# Patient Record
Sex: Female | Born: 1960 | Race: White | Hispanic: No | Marital: Married | Smoking: Never smoker
Health system: Southern US, Community
[De-identification: ages and names within clinical notes are randomized; demographics above are authoritative.]

## PROBLEM LIST (undated history)

## (undated) DIAGNOSIS — Z5189 Encounter for other specified aftercare: Secondary | ICD-10-CM

## (undated) DIAGNOSIS — J302 Other seasonal allergic rhinitis: Secondary | ICD-10-CM

## (undated) DIAGNOSIS — C439 Malignant melanoma of skin, unspecified: Secondary | ICD-10-CM

## (undated) HISTORY — DX: Other seasonal allergic rhinitis: J30.2

## (undated) HISTORY — DX: Encounter for other specified aftercare: Z51.89

## (undated) HISTORY — DX: Malignant melanoma of skin, unspecified: C43.9

## (undated) HISTORY — PX: LAPAROSCOPY: SHX197

---

## 1987-04-28 DIAGNOSIS — Z5189 Encounter for other specified aftercare: Secondary | ICD-10-CM

## 1987-04-28 HISTORY — DX: Encounter for other specified aftercare: Z51.89

## 1988-04-27 HISTORY — PX: LAPAROSCOPY: SHX197

## 1995-04-28 HISTORY — PX: ABDOMINAL HYSTERECTOMY: SHX81

## 1998-05-07 ENCOUNTER — Other Ambulatory Visit: Admission: RE | Admit: 1998-05-07 | Discharge: 1998-05-07 | Payer: Self-pay | Admitting: Gynecology

## 1999-10-17 ENCOUNTER — Other Ambulatory Visit: Admission: RE | Admit: 1999-10-17 | Discharge: 1999-10-17 | Payer: Self-pay | Admitting: Gynecology

## 2001-04-05 ENCOUNTER — Ambulatory Visit (HOSPITAL_COMMUNITY): Admission: RE | Admit: 2001-04-05 | Discharge: 2001-04-05 | Payer: Self-pay | Admitting: Gynecology

## 2001-04-05 ENCOUNTER — Encounter: Payer: Self-pay | Admitting: Gynecology

## 2001-04-25 ENCOUNTER — Other Ambulatory Visit: Admission: RE | Admit: 2001-04-25 | Discharge: 2001-04-25 | Payer: Self-pay | Admitting: Gynecology

## 2001-07-26 ENCOUNTER — Ambulatory Visit (HOSPITAL_COMMUNITY): Admission: RE | Admit: 2001-07-26 | Discharge: 2001-07-26 | Payer: Self-pay | Admitting: *Deleted

## 2001-08-05 ENCOUNTER — Ambulatory Visit (HOSPITAL_BASED_OUTPATIENT_CLINIC_OR_DEPARTMENT_OTHER): Admission: RE | Admit: 2001-08-05 | Discharge: 2001-08-05 | Payer: Self-pay | Admitting: *Deleted

## 2001-08-05 ENCOUNTER — Encounter (INDEPENDENT_AMBULATORY_CARE_PROVIDER_SITE_OTHER): Payer: Self-pay | Admitting: *Deleted

## 2002-04-27 DIAGNOSIS — C439 Malignant melanoma of skin, unspecified: Secondary | ICD-10-CM

## 2002-04-27 HISTORY — PX: OTHER SURGICAL HISTORY: SHX169

## 2002-04-27 HISTORY — DX: Malignant melanoma of skin, unspecified: C43.9

## 2002-06-26 ENCOUNTER — Encounter: Payer: Self-pay | Admitting: Orthopedic Surgery

## 2002-06-26 ENCOUNTER — Ambulatory Visit (HOSPITAL_COMMUNITY): Admission: RE | Admit: 2002-06-26 | Discharge: 2002-06-26 | Payer: Self-pay | Admitting: Orthopedic Surgery

## 2002-07-26 ENCOUNTER — Other Ambulatory Visit: Admission: RE | Admit: 2002-07-26 | Discharge: 2002-07-26 | Payer: Self-pay | Admitting: Gynecology

## 2003-10-15 ENCOUNTER — Other Ambulatory Visit: Admission: RE | Admit: 2003-10-15 | Discharge: 2003-10-15 | Payer: Self-pay | Admitting: Gynecology

## 2005-01-05 ENCOUNTER — Other Ambulatory Visit: Admission: RE | Admit: 2005-01-05 | Discharge: 2005-01-05 | Payer: Self-pay | Admitting: Gynecology

## 2005-01-09 ENCOUNTER — Ambulatory Visit (HOSPITAL_COMMUNITY): Admission: RE | Admit: 2005-01-09 | Discharge: 2005-01-09 | Payer: Self-pay | Admitting: Gynecology

## 2006-01-29 ENCOUNTER — Ambulatory Visit (HOSPITAL_COMMUNITY): Admission: RE | Admit: 2006-01-29 | Discharge: 2006-01-29 | Payer: Self-pay | Admitting: Gynecology

## 2006-02-10 ENCOUNTER — Encounter: Admission: RE | Admit: 2006-02-10 | Discharge: 2006-02-10 | Payer: Self-pay | Admitting: Gynecology

## 2006-02-15 ENCOUNTER — Other Ambulatory Visit: Admission: RE | Admit: 2006-02-15 | Discharge: 2006-02-15 | Payer: Self-pay | Admitting: Gynecology

## 2007-03-14 ENCOUNTER — Encounter: Admission: RE | Admit: 2007-03-14 | Discharge: 2007-03-14 | Payer: Self-pay | Admitting: Gynecology

## 2007-03-14 ENCOUNTER — Other Ambulatory Visit: Admission: RE | Admit: 2007-03-14 | Discharge: 2007-03-14 | Payer: Self-pay | Admitting: Gynecology

## 2008-07-19 ENCOUNTER — Encounter: Admission: RE | Admit: 2008-07-19 | Discharge: 2008-07-19 | Payer: Self-pay | Admitting: Gynecology

## 2010-05-17 ENCOUNTER — Encounter: Payer: Self-pay | Admitting: Gynecology

## 2010-09-12 NOTE — Op Note (Signed)
. [facility]  Patient:    Natasha Foster, Natasha Foster Visit Number: [id number] MRN: [id number]          Service Type: DSU Location: [facility] Attending Physician:  Vikki Ports. Dictated by:   Catalina Lunger, M.D. Proc. Date: 08/05/01 Admit Date:  08/05/2001   CC:         Ria Bush. Jorja Loa, M.D.   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  PROCEDURES: 1. Blue dye injection. 2. Lymphatic mapping. 3. Wide excision of malignant melanoma, measuring 4 cm. 4. Left inguinal sentinel lymph node biopsy.  ANESTHESIA:  General.  DESCRIPTION OF PROCEDURE: After having radiology inject the isotope into the left buttocks, the patient was taken to preop holding. She was then taken to the operating room and placed in a supine position.  She underwent general anesthesia and was placed in the right decubitus position.  The area of the previous biopsy was identified, prepped, and draped, and about 1 cm of ______ blue dye was injected intradermally.  The area was prepped and draped in the normal sterile fashion.  An elliptical incision measuring 1 cm out from the periphery of the previously biopsied area was obtained.  This was extended in both superior and inferior directions to a smoother ellipse.  The tissue was excised down to the subcutaneous fat and sent for pathologic evaluation. Adequate hemostasis was ensured.  Flaps were created in all directions, and the skin was closed with interrupted 3-0 nylon sutures.  Using the Neoprobe after the patient had been placed in the supine position, the left groin was prepped and draped in the normal sterile fashion.  An area of high activity in the groin crease was identified.  A small incision was made over it and dissected down through subcutaneous fat.  Following blue lymphatics in the lymph node, we  isolated a high activity blue lymph node, which was excised.  Small venous tributaries were ligated and clipped, and the ex vivo counts were in the 3,000-5,000 range. In vivo residual counts were between 0 and 5.  The defect was closed with a running 3-0 subcutaneous suture and a subcuticular 4-0 Monocryl suture.  Steri-Strips and sterile dressings were applied.  The patient tolerated the procedure well and went to PACU in good condition. Dictated by:   Catalina Lunger, M.D. Attending Physician:  Danna Hefty R. DD:  08/05/01 TD:  08/07/01 Job: [id number]

## 2010-09-12 NOTE — Op Note (Signed)
[place]  Patient:    VENTURA, LEGGITT Visit Number: [id number] MRN: [id number]          Service Type: DSU Location: [facility] Attending Physician:  Vikki Ports. Dictated by:   Vikki Ports, M.D. Proc. Date: 08/05/01 Admit Date:  08/05/2001   CC:         Ria Bush. Jorja Loa, M.D.   Operative Report  PREOPERATIVE DIAGNOSIS:   Malignant 4 melanoma of the left buttock.  POSTOPERATIVE DIAGNOSIS:  Malignant 4 melanoma of the left buttock.  OPERATION: 1. Blue dye injection. 2. Lymphatic mapping. 3. Wide excision of malignant melanoma measuring 4 cm. 4. Left inguinal sentinel lymph node biopsy.  SURGEON:  Vikki Ports, M.D.  ANESTHESIA:  General.  DESCRIPTION OF PROCEDURE:  After having radiology inject the isotope into the left buttock, the patient was taken to preoperative holding.  She was then taken to the operating room, placed in the supine position and underwent general anesthesia and was placed in the right decubitus position.  The area of the previous biopsy was identified, prepped and draped and about 1 cc of Lymphazurin blue dye was injected intradermally.  The area was prepped and draped in the normal sterile fashion.  An elliptical incision measuring 1 cm out from the periphery from the previously biopsied area was obtained. This was extended in both superior and inferior directions to make a smoother ellipse.  The tissue was excised down to the subcutaneous fat and sent for pathologic evaluation.  Adequate hemostasis was ensured.  Flaps were created in all directions, and the skin was closed with interrupted 3-0 nylon sutures.  Using the Neoprobe after the patient had been placed in the supine position, the left groin was prepped and draped in the normal sterile fashion.  An area of high activity in the groin crease was identified.  A small incision was made over it.  I dissected down through subcutaneous  fat.  Following blue lymphatics and the lymph node I isolated a high activity blue lymph node which was excised.  Small venous tributaries were ligated and clipped, and the ex vivo counts were in the 3000-5000 range.  In vivo residual counts were between 0-5.  The defect was closed with a running 3-0 subcutaneous suture and a 4-0 subcuticular 4-0 Monocryl suture.  Steri-Strips and sterile dressing were applied.  The patient tolerated the procedure well and went to PACU in good condition. Dictated by:   Vikki Ports, M.D. Attending Physician:  Danna Hefty R. DD:  08/05/01 TD:  08/07/01 Job: [id number]

## 2010-10-21 ENCOUNTER — Other Ambulatory Visit: Payer: Self-pay | Admitting: Gynecology

## 2010-10-21 DIAGNOSIS — Z1231 Encounter for screening mammogram for malignant neoplasm of breast: Secondary | ICD-10-CM

## 2010-11-04 ENCOUNTER — Ambulatory Visit
Admission: RE | Admit: 2010-11-04 | Discharge: 2010-11-04 | Disposition: A | Discharge status: Home / Self Care | Payer: BC Managed Care – PPO | Source: Ambulatory Visit | Attending: Gynecology | Admitting: Gynecology

## 2010-11-04 DIAGNOSIS — Z1231 Encounter for screening mammogram for malignant neoplasm of breast: Secondary | ICD-10-CM

## 2011-11-02 ENCOUNTER — Other Ambulatory Visit: Payer: Self-pay | Admitting: Gynecology

## 2011-11-02 DIAGNOSIS — Z1231 Encounter for screening mammogram for malignant neoplasm of breast: Secondary | ICD-10-CM

## 2011-11-06 ENCOUNTER — Ambulatory Visit: Payer: BC Managed Care – PPO

## 2011-11-12 ENCOUNTER — Ambulatory Visit
Admission: RE | Admit: 2011-11-12 | Discharge: 2011-11-12 | Disposition: A | Discharge status: Home / Self Care | Payer: BC Managed Care – PPO | Source: Ambulatory Visit | Attending: Gynecology | Admitting: Gynecology

## 2011-11-12 DIAGNOSIS — Z1231 Encounter for screening mammogram for malignant neoplasm of breast: Secondary | ICD-10-CM

## 2012-08-16 ENCOUNTER — Other Ambulatory Visit: Payer: Self-pay | Admitting: Orthopedic Surgery

## 2012-08-16 DIAGNOSIS — M25562 Pain in left knee: Secondary | ICD-10-CM

## 2012-08-19 ENCOUNTER — Ambulatory Visit
Admission: RE | Admit: 2012-08-19 | Discharge: 2012-08-19 | Disposition: A | Discharge status: Home / Self Care | Payer: BC Managed Care – PPO | Source: Ambulatory Visit | Attending: Orthopedic Surgery | Admitting: Orthopedic Surgery

## 2012-08-19 DIAGNOSIS — M25562 Pain in left knee: Secondary | ICD-10-CM

## 2013-09-26 ENCOUNTER — Encounter: Payer: Self-pay | Admitting: Internal Medicine

## 2013-09-28 ENCOUNTER — Ambulatory Visit (AMBULATORY_SURGERY_CENTER): Payer: Self-pay | Admitting: *Deleted

## 2013-09-28 VITALS — Ht 66.0 in | Wt 187.6 lb

## 2013-09-28 DIAGNOSIS — Z1211 Encounter for screening for malignant neoplasm of colon: Secondary | ICD-10-CM

## 2013-09-28 MED ORDER — MOVIPREP 100 G PO SOLR: ORAL | Status: DC

## 2013-09-28 NOTE — Progress Notes (Signed)
No allergies to eggs or soy. No problems with anesthesia.  Pt given Emmi instructions for colonoscopy  No oxygen use  No diet drug use  

## 2013-10-03 ENCOUNTER — Encounter: Payer: Self-pay | Admitting: Internal Medicine

## 2013-10-09 ENCOUNTER — Ambulatory Visit (AMBULATORY_SURGERY_CENTER): Payer: BC Managed Care – PPO | Admitting: Internal Medicine

## 2013-10-09 ENCOUNTER — Encounter: Payer: Self-pay | Admitting: Internal Medicine

## 2013-10-09 VITALS — BP 132/71 | HR 63 | Temp 98.1°F | Resp 18 | Ht 66.0 in | Wt 187.0 lb

## 2013-10-09 DIAGNOSIS — Z1211 Encounter for screening for malignant neoplasm of colon: Secondary | ICD-10-CM

## 2013-10-09 MED ORDER — SODIUM CHLORIDE 0.9 % IV SOLN: 500.0000 mL | INTRAVENOUS | Status: DC

## 2013-10-09 NOTE — Patient Instructions (Signed)
YOU HAD AN ENDOSCOPIC PROCEDURE TODAY AT THE [facility]: Refer to the procedure report that was given to you for any specific questions about what was found during the examination.  If the procedure report does not answer your questions, please call your gastroenterologist to clarify.  If you requested that your care partner not be given the details of your procedure findings, then the procedure report has been included in a sealed envelope for you to review at your convenience later.  YOU SHOULD EXPECT: Some feelings of bloating in the abdomen. Passage of more gas than usual.  Walking can help get rid of the air that was put into your GI tract during the procedure and reduce the bloating. If you had a lower endoscopy (such as a colonoscopy or flexible sigmoidoscopy) you may notice spotting of blood in your stool or on the toilet paper. If you underwent a bowel prep for your procedure, then you may not have a normal bowel movement for a few days.  DIET: Your first meal following the procedure should be a light meal and then it is ok to progress to your normal diet.  A half-sandwich or bowl of soup is an example of a good first meal.  Heavy or fried foods are harder to digest and may make you feel nauseous or bloated.  Likewise meals heavy in dairy and vegetables can cause extra gas to form and this can also increase the bloating.  Drink plenty of fluids but you should avoid alcoholic beverages for 24 hours.  ACTIVITY: Your care partner should take you home directly after the procedure.  You should plan to take it easy, moving slowly for the rest of the day.  You can resume normal activity the day after the procedure however you should NOT DRIVE or use heavy machinery for 24 hours (because of the sedation medicines used during the test).    SYMPTOMS TO REPORT IMMEDIATELY: A gastroenterologist can be reached at any hour.  During normal business hours, 8:30 AM to 5:00 PM Monday through Friday,  call [phone].  After hours and on weekends, please call the GI answering service at [phone] who will take a message and have the physician on call contact you.   Following lower endoscopy (colonoscopy or flexible sigmoidoscopy):  Excessive amounts of blood in the stool  Significant tenderness or worsening of abdominal pains  Swelling of the abdomen that is new, acute  Fever of 100F or higher  FOLLOW UP: If any biopsies were taken you will be contacted by phone or by letter within the next 1-3 weeks.  Call your gastroenterologist if you have not heard about the biopsies in 3 weeks.  Our staff will call the home number listed on your records the next business day following your procedure to check on you and address any questions or concerns that you may have at that time regarding the information given to you following your procedure. This is a courtesy call and so if there is no answer at the home number and we have not heard from you through the emergency physician on call, we will assume that you have returned to your regular daily activities without incident.  SIGNATURES/CONFIDENTIALITY: You and/or your care partner have signed paperwork which will be entered into your electronic medical record.  These signatures attest to the fact that that the information above on your After Visit Summary has been reviewed and is understood.  Full responsibility of the confidentiality of this   discharge information lies with you and/or your care-partner.  Normal exam  Repeat colonoscopy in 10 years-2025.

## 2013-10-09 NOTE — Progress Notes (Signed)
Procedure ends, to recovery, report given and VSS. 

## 2013-10-09 NOTE — Op Note (Signed)
[place]  [address]. [place], [address]   COLONOSCOPY PROCEDURE REPORT  PATIENT: Natasha Foster, Natasha Foster  MR#: [id number] BIRTHDATE: 1960/11/09 , 43  yrs. old GENDER: Female ENDOSCOPIST: Eustace Quail, MD REFERRED BY:.  [employer] PROCEDURE DATE:  10/09/2013 PROCEDURE:   Colonoscopy, screening First Screening Colonoscopy - Avg.  risk and is 50 yrs.  old or older Yes.  Prior Negative Screening - Now for repeat screening. N/A  History of Adenoma - Now for follow-up colonoscopy & has been > or = to 3 yrs.  N/A  Polyps Removed Today? No.  Recommend repeat exam, <10 yrs? No. ASA CLASS:   Class II INDICATIONS:average risk screening. MEDICATIONS: MAC sedation, administered by CRNA and propofol (Diprivan) 270mg  IV  DESCRIPTION OF PROCEDURE:   After the risks benefits and alternatives of the procedure were thoroughly explained, informed consent was obtained.  A digital rectal exam revealed hemorrhoids. The LB [id number]  endoscope was introduced through the anus and advanced to the cecum, which was identified by both the appendix and ileocecal valve. No adverse events experienced.   The quality of the prep was excellent, using MoviPrep  The instrument was then slowly withdrawn as the colon was fully examined.      COLON FINDINGS: A normal appearing cecum, ileocecal valve, and appendiceal orifice were identified.  The ascending, hepatic flexure, transverse, splenic flexure, descending, sigmoid colon and rectum appeared unremarkable.  No polyps or cancers were seen. Retroflexed views revealed internal hemorrhoids. The time to cecum=1 minutes 58 seconds.  Withdrawal time=9 minutes 16 seconds. The scope was withdrawn and the procedure completed.  COMPLICATIONS: There were no complications.  ENDOSCOPIC IMPRESSION: Normal colon  RECOMMENDATIONS: Continue current colorectal screening recommendations for "routine risk" patients with a repeat colonoscopy in 10  years.   eSigned:  Eustace Quail, MD 10/09/2013 9:36 AM   cc: The Patient and Darcus Austin, MD

## 2013-10-10 ENCOUNTER — Telehealth: Payer: Self-pay | Admitting: *Deleted

## 2013-10-10 NOTE — Telephone Encounter (Signed)
  Follow up Call-  Call back number 10/09/2013  Post procedure Call Back phone  # [phone]  Permission to leave phone message No  comments does not have voicemail     Patient questions:  Do you have a fever, pain , or abdominal swelling? no Pain Score  0 *  Have you tolerated food without any problems? yes  Have you been able to return to your normal activities? yes  Do you have any questions about your discharge instructions: Diet   no Medications  no Follow up visit  no  Do you have questions or concerns about your Care? no  Actions: * If pain score is 4 or above: No action needed, pain <4.

## 2015-01-21 ENCOUNTER — Other Ambulatory Visit: Payer: Self-pay

## 2015-01-21 DIAGNOSIS — Z1231 Encounter for screening mammogram for malignant neoplasm of breast: Secondary | ICD-10-CM

## 2015-01-23 ENCOUNTER — Ambulatory Visit
Admission: RE | Admit: 2015-01-23 | Discharge: 2015-01-23 | Disposition: A | Discharge status: Home / Self Care | Payer: BLUE CROSS/BLUE SHIELD | Source: Ambulatory Visit

## 2015-01-23 DIAGNOSIS — Z1231 Encounter for screening mammogram for malignant neoplasm of breast: Secondary | ICD-10-CM

## 2015-01-25 ENCOUNTER — Ambulatory Visit: Payer: Self-pay

## 2016-01-07 DIAGNOSIS — Z85820 Personal history of malignant melanoma of skin: Secondary | ICD-10-CM | POA: Diagnosis not present

## 2016-01-07 DIAGNOSIS — L821 Other seborrheic keratosis: Secondary | ICD-10-CM | POA: Diagnosis not present

## 2016-01-07 DIAGNOSIS — L820 Inflamed seborrheic keratosis: Secondary | ICD-10-CM | POA: Diagnosis not present

## 2016-01-07 DIAGNOSIS — D2361 Other benign neoplasm of skin of right upper limb, including shoulder: Secondary | ICD-10-CM | POA: Diagnosis not present

## 2016-01-07 DIAGNOSIS — D485 Neoplasm of uncertain behavior of skin: Secondary | ICD-10-CM | POA: Diagnosis not present

## 2016-01-07 DIAGNOSIS — D2261 Melanocytic nevi of right upper limb, including shoulder: Secondary | ICD-10-CM | POA: Diagnosis not present

## 2016-01-07 DIAGNOSIS — D225 Melanocytic nevi of trunk: Secondary | ICD-10-CM | POA: Diagnosis not present

## 2016-01-14 DIAGNOSIS — L080 Pyoderma: Secondary | ICD-10-CM | POA: Diagnosis not present

## 2016-01-14 DIAGNOSIS — L259 Unspecified contact dermatitis, unspecified cause: Secondary | ICD-10-CM | POA: Diagnosis not present

## 2016-02-03 DIAGNOSIS — L905 Scar conditions and fibrosis of skin: Secondary | ICD-10-CM | POA: Diagnosis not present

## 2016-02-21 DIAGNOSIS — B349 Viral infection, unspecified: Secondary | ICD-10-CM | POA: Diagnosis not present

## 2016-04-01 DIAGNOSIS — Z0000 Encounter for general adult medical examination without abnormal findings: Secondary | ICD-10-CM | POA: Diagnosis not present

## 2016-04-01 DIAGNOSIS — E559 Vitamin D deficiency, unspecified: Secondary | ICD-10-CM | POA: Diagnosis not present

## 2016-04-01 DIAGNOSIS — E538 Deficiency of other specified B group vitamins: Secondary | ICD-10-CM | POA: Diagnosis not present

## 2016-04-01 DIAGNOSIS — Z23 Encounter for immunization: Secondary | ICD-10-CM | POA: Diagnosis not present

## 2016-09-14 DIAGNOSIS — R3915 Urgency of urination: Secondary | ICD-10-CM | POA: Diagnosis not present

## 2017-04-15 ENCOUNTER — Other Ambulatory Visit: Payer: Self-pay | Admitting: Family Medicine

## 2017-04-15 DIAGNOSIS — E538 Deficiency of other specified B group vitamins: Secondary | ICD-10-CM | POA: Diagnosis not present

## 2017-04-15 DIAGNOSIS — Z0000 Encounter for general adult medical examination without abnormal findings: Secondary | ICD-10-CM | POA: Diagnosis not present

## 2017-04-15 DIAGNOSIS — Z1231 Encounter for screening mammogram for malignant neoplasm of breast: Secondary | ICD-10-CM

## 2017-04-15 DIAGNOSIS — E559 Vitamin D deficiency, unspecified: Secondary | ICD-10-CM | POA: Diagnosis not present

## 2017-04-15 DIAGNOSIS — Z136 Encounter for screening for cardiovascular disorders: Secondary | ICD-10-CM | POA: Diagnosis not present

## 2017-05-14 ENCOUNTER — Ambulatory Visit
Admission: RE | Admit: 2017-05-14 | Discharge: 2017-05-14 | Disposition: A | Discharge status: Home / Self Care | Payer: BLUE CROSS/BLUE SHIELD | Source: Ambulatory Visit | Attending: Family Medicine | Admitting: Family Medicine

## 2017-05-14 DIAGNOSIS — Z1231 Encounter for screening mammogram for malignant neoplasm of breast: Secondary | ICD-10-CM

## 2018-04-21 DIAGNOSIS — M2550 Pain in unspecified joint: Secondary | ICD-10-CM | POA: Diagnosis not present

## 2018-04-21 DIAGNOSIS — E669 Obesity, unspecified: Secondary | ICD-10-CM | POA: Diagnosis not present

## 2018-04-21 DIAGNOSIS — Z13220 Encounter for screening for lipoid disorders: Secondary | ICD-10-CM | POA: Diagnosis not present

## 2018-04-21 DIAGNOSIS — E538 Deficiency of other specified B group vitamins: Secondary | ICD-10-CM | POA: Diagnosis not present

## 2018-04-21 DIAGNOSIS — Z23 Encounter for immunization: Secondary | ICD-10-CM | POA: Diagnosis not present

## 2018-04-21 DIAGNOSIS — Z0000 Encounter for general adult medical examination without abnormal findings: Secondary | ICD-10-CM | POA: Diagnosis not present

## 2018-04-21 DIAGNOSIS — R7989 Other specified abnormal findings of blood chemistry: Secondary | ICD-10-CM | POA: Diagnosis not present

## 2018-04-21 DIAGNOSIS — E559 Vitamin D deficiency, unspecified: Secondary | ICD-10-CM | POA: Diagnosis not present

## 2018-04-25 ENCOUNTER — Ambulatory Visit (INDEPENDENT_AMBULATORY_CARE_PROVIDER_SITE_OTHER): Payer: Self-pay

## 2018-04-25 ENCOUNTER — Encounter (INDEPENDENT_AMBULATORY_CARE_PROVIDER_SITE_OTHER): Payer: Self-pay | Admitting: Orthopaedic Surgery

## 2018-04-25 ENCOUNTER — Ambulatory Visit (INDEPENDENT_AMBULATORY_CARE_PROVIDER_SITE_OTHER): Payer: BLUE CROSS/BLUE SHIELD | Admitting: Orthopaedic Surgery

## 2018-04-25 VITALS — BP 132/86 | HR 82 | Ht 67.0 in | Wt 192.0 lb

## 2018-04-25 DIAGNOSIS — M79641 Pain in right hand: Secondary | ICD-10-CM

## 2018-04-25 DIAGNOSIS — M79601 Pain in right arm: Secondary | ICD-10-CM | POA: Diagnosis not present

## 2018-04-25 DIAGNOSIS — M79642 Pain in left hand: Secondary | ICD-10-CM

## 2018-04-25 DIAGNOSIS — M180 Bilateral primary osteoarthritis of first carpometacarpal joints: Secondary | ICD-10-CM | POA: Insufficient documentation

## 2018-04-25 NOTE — Progress Notes (Signed)
Office Visit Note   Patient: Natasha Foster           Date of Birth: 1960-12-06           MRN: [id number] Visit Date: 04/25/2018              Requested by: Darcus Austin, MD [address] [place], [place] [address] PCP: Darcus Austin, MD   Assessment & Plan: Visit Diagnoses:  1. Bilateral hand pain   2. Right arm pain   3. Primary osteoarthritis of both first carpometacarpal joints     Plan: We discussed using intermittent Tylenol.  Splint applied she can use on her right thumb when she is active doing pulling pushing housecleaning type activities.  Continued walking program with weight loss and health maintenance discussed.  We plan to recheck her in 2 months.  X-ray results with for [facility] arthritis reviewed.  Follow-Up Instructions: Return in about 2 months (around 06/25/2018).   Orders:  Orders Placed This Encounter  Procedures  . XR Humerus Right  . XR Hand Complete Right  . XR Hand Complete Left   No orders of the defined types were placed in this encounter.     Procedures: No procedures performed   Clinical Data: No additional findings.   Subjective: Chief Complaint  Patient presents with  . Right Hand - Pain  . Left Hand - Pain  . Right Arm - Pain    HPI 57 year old female right-hand-dominant seen with bilateral hand pain at the base of the thumb.  Previous injection left hand by Dr. due to first [facility] joint gave her relief for many months.  She been pulling on the string of relief but over had increased pain in her right shoulder region.  This is improved slightly.  She has pain and tenderness in the upper arm pain with gripping and squeezing of the first [facility] joint on the right hand worse than left hand.  Her mother passed away after several years of illness this is something she has had to deal with getting over.  She gained some weight is lost 8 pounds now and is working on walking and weight loss due to increased BMI.  She denies fever or  chills.  No associated neck pain.  No bowel or bladder symptoms.  No problems in her shoulder with bathing or washing her dressing.  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  History of malignant stage IV melanoma left buttocks.  BMI 30, positive for bilateral [facility] arthritis, recent lab work showed some elevation of liver enzymes after she had been taking likely supratherapeutic dosages of Aleve now stopped on Aleve otherwise -14 point systems as a pertains HPI.   Objective: Vital Signs: BP 132/86   Pulse 82   Ht 5\' 7"  (1.702 m)   Wt 192 lb (87.1 kg)   BMI 30.07 kg/m   Physical Exam Constitutional:      Appearance: She is well-developed.  HENT:     Head: Normocephalic.     Right Ear: External ear normal.     Left Ear: External ear normal.  Eyes:     Pupils: Pupils are equal, round, and reactive to light.  Neck:     Thyroid: No thyromegaly.     Trachea: No tracheal deviation.  Cardiovascular:     Rate and Rhythm: Normal rate.  Pulmonary:     Effort: Pulmonary effort is normal.  Abdominal:     Palpations: Abdomen is soft.  Skin:    General: Skin is warm and dry.  Neurological:     Mental Status: She is alert and oriented to person, place, and time.  Psychiatric:        Behavior: Behavior normal.     Ortho Exam negative brachial plexus compression test.  Negative Spurling upper extremity reflexes are 2+ and symmetrical.  Tenderness positive grind test for [facility] joint right greater than left.  EPL FPL active.  No thenar atrophy.  Negative carpal compression test.  Interossei are strong.  Minimal discomfort with impingement right shoulder.  Long of the biceps mild to moderately tender.  No distal biceps tendon tenderness at the antecubital fossa.  Normal pronation supination.  Positive bilateral grind test negative Finkelstein test.  Dorsal compartments over the wrist are normal.  No snuffbox tenderness.  Specialty Comments:  No specialty comments  available.  Imaging: No results found.   PMFS History: There are no active problems to display for this patient.  Past Medical History:  Diagnosis Date  . Blood transfusion without reported diagnosis 1989   during laparoscopy  . Melanoma ([place]) 2004   left buttocks  . Seasonal allergies     Family History  Problem Relation Age of Onset  . Colon cancer Neg Hx   . Esophageal cancer Neg Hx   . Rectal cancer Neg Hx   . Stomach cancer Neg Hx     Past Surgical History:  Procedure Laterality Date  . ABDOMINAL HYSTERECTOMY  1997  . LAPAROSCOPY  1990   for endometriosis; punctured aorta during surgery, per pt" didn't put intestines back in correctly- had to go back in and repostiton correctly  . LAPAROSCOPY     x2- prior to puncturing of aorta  . melanoma surgery Left 2004   buttocks   Social History   Occupational History  . Not on file  Tobacco Use  . Smoking status: Never Smoker  . Smokeless tobacco: Never Used  Substance and Sexual Activity  . Alcohol use: Yes    Comment: rare  . Drug use: No  . Sexual activity: Not on file

## 2018-06-22 ENCOUNTER — Other Ambulatory Visit: Payer: Self-pay | Admitting: Dermatology

## 2018-06-22 DIAGNOSIS — Z85820 Personal history of malignant melanoma of skin: Secondary | ICD-10-CM | POA: Diagnosis not present

## 2018-06-22 DIAGNOSIS — D485 Neoplasm of uncertain behavior of skin: Secondary | ICD-10-CM | POA: Diagnosis not present

## 2018-06-22 DIAGNOSIS — L720 Epidermal cyst: Secondary | ICD-10-CM | POA: Diagnosis not present

## 2018-06-22 DIAGNOSIS — D229 Melanocytic nevi, unspecified: Secondary | ICD-10-CM | POA: Diagnosis not present

## 2018-06-23 DIAGNOSIS — E559 Vitamin D deficiency, unspecified: Secondary | ICD-10-CM | POA: Diagnosis not present

## 2018-06-23 DIAGNOSIS — R740 Nonspecific elevation of levels of transaminase and lactic acid dehydrogenase [LDH]: Secondary | ICD-10-CM | POA: Diagnosis not present

## 2018-06-28 ENCOUNTER — Ambulatory Visit (INDEPENDENT_AMBULATORY_CARE_PROVIDER_SITE_OTHER): Payer: BLUE CROSS/BLUE SHIELD | Admitting: Orthopaedic Surgery

## 2018-10-03 ENCOUNTER — Telehealth: Payer: Self-pay | Admitting: Internal Medicine

## 2018-10-03 ENCOUNTER — Telehealth: Payer: Self-pay

## 2018-10-03 NOTE — Telephone Encounter (Signed)
Patient called would like to know what she should do she has been vomiting a having diarrhea.

## 2018-10-03 NOTE — Telephone Encounter (Signed)
No answer - mailbox full.

## 2018-10-03 NOTE — Telephone Encounter (Signed)
Pt states she has been having episodes where she will burp and it tastes like rotten eggs, then she will have diarrhea that turns into water followed by vomiting. Report this is the 4th episode she has had like this in the past 4 weeks. Pt requesting sooner appt. Pt scheduled to see Nicoletta Ba PA 10/10/18@8 :30am. Pt wanted to know what she could try prior to appt. Discussed with pt that she could try FD gard and/or a probiotic. Pt verbalized understanding and is aware of appt.

## 2018-10-03 NOTE — Telephone Encounter (Signed)
Reassured patient and told her to give the FD Gard/Probiotic a few days to see if it helped.  I told her that Amy would do an in depth evaluation to decide how to proceed but hopefully these medicines would keep her feeling well until that appointment.  Patient agreed.

## 2018-10-10 ENCOUNTER — Other Ambulatory Visit: Payer: Self-pay

## 2018-10-10 ENCOUNTER — Other Ambulatory Visit (INDEPENDENT_AMBULATORY_CARE_PROVIDER_SITE_OTHER): Payer: BC Managed Care – PPO

## 2018-10-10 ENCOUNTER — Ambulatory Visit (INDEPENDENT_AMBULATORY_CARE_PROVIDER_SITE_OTHER): Payer: BC Managed Care – PPO | Admitting: Physician Assistant

## 2018-10-10 ENCOUNTER — Telehealth: Payer: Self-pay

## 2018-10-10 ENCOUNTER — Encounter: Payer: Self-pay | Admitting: Physician Assistant

## 2018-10-10 VITALS — BP 124/78 | HR 81 | Temp 98.6°F | Ht 67.0 in | Wt 186.2 lb

## 2018-10-10 DIAGNOSIS — R112 Nausea with vomiting, unspecified: Secondary | ICD-10-CM | POA: Diagnosis not present

## 2018-10-10 DIAGNOSIS — R197 Diarrhea, unspecified: Secondary | ICD-10-CM | POA: Diagnosis not present

## 2018-10-10 DIAGNOSIS — R142 Eructation: Secondary | ICD-10-CM

## 2018-10-10 LAB — COMPREHENSIVE METABOLIC PANEL
ALT: 16 U/L (ref 0–35)
AST: 17 U/L (ref 0–37)
Albumin: 4.2 g/dL (ref 3.5–5.2)
Alkaline Phosphatase: 58 U/L (ref 39–117)
BUN: 13 mg/dL (ref 6–23)
CO2: 26 mEq/L (ref 19–32)
Calcium: 9.6 mg/dL (ref 8.4–10.5)
Chloride: 106 mEq/L (ref 96–112)
Creatinine, Ser: 0.9 mg/dL (ref 0.40–1.20)
GFR: 63.96 mL/min (ref >60.00)
Glucose, Bld: 98 mg/dL (ref 70–99)
Potassium: 4 mEq/L (ref 3.5–5.1)
Sodium: 140 mEq/L (ref 135–145)
Total Bilirubin: 0.5 mg/dL (ref 0.2–1.2)
Total Protein: 7.1 g/dL (ref 6.0–8.3)

## 2018-10-10 LAB — CBC WITH DIFFERENTIAL/PLATELET
Basophils Absolute: 0 10*3/uL (ref 0.0–0.1)
Basophils Relative: 0.5 % (ref 0.0–3.0)
Eosinophils Absolute: 0.1 10*3/uL (ref 0.0–0.7)
Eosinophils Relative: 1.8 % (ref 0.0–5.0)
HCT: 37.6 % (ref 36.0–46.0)
Hemoglobin: 12.6 g/dL (ref 12.0–15.0)
Lymphocytes Relative: 34.2 % (ref 12.0–46.0)
Lymphs Abs: 1.5 10*3/uL (ref 0.7–4.0)
MCHC: 33.5 g/dL (ref 30.0–36.0)
MCV: 85.8 fl (ref 78.0–100.0)
Monocytes Absolute: 0.5 10*3/uL (ref 0.1–1.0)
Monocytes Relative: 11.8 % (ref 3.0–12.0)
Neutro Abs: 2.3 10*3/uL (ref 1.4–7.7)
Neutrophils Relative %: 51.7 % (ref 43.0–77.0)
Platelets: 256 10*3/uL (ref 150.0–400.0)
RBC: 4.38 Mil/uL (ref 3.87–5.11)
RDW: 13.7 % (ref 11.5–15.5)
WBC: 4.4 10*3/uL (ref 4.0–10.5)

## 2018-10-10 LAB — SEDIMENTATION RATE: Sed Rate: 12 mm/hr (ref 0–30)

## 2018-10-10 MED ORDER — HYOSCYAMINE SULFATE 0.125 MG SL SUBL: 0.1250 mg | SUBLINGUAL_TABLET | SUBLINGUAL | 2 refills | Status: AC | PRN

## 2018-10-10 NOTE — Telephone Encounter (Signed)
Erroneous encounter

## 2018-10-10 NOTE — Patient Instructions (Addendum)
If you are age 60 or older, your body mass index should be between 23-30. Your Body mass index is 29.17 kg/m. If this is out of the aforementioned range listed, please consider follow up with your Primary Care Provider.  If you are age 46 or younger, your body mass index should be between 19-25. Your Body mass index is 29.17 kg/m. If this is out of the aformentioned range listed, please consider follow up with your Primary Care Provider.   We have sent the following medications to your pharmacy for you to pick up at your convenience: Levsin  Continue Prilosec (over-the-counter) take one every morning 30 minutes before breakfast.  Continue Align once daily.  Gradually advance diet.  You have been scheduled for an abdominal ultrasound at [facility] (1st floor of hospital) on 10/14/18 at 1:30 pm. Please arrive 15 minutes prior to your appointment for registration. Make certain not to have anything to eat or drink 6 hours prior to your appointment. Should you need to reschedule your appointment, please contact radiology at [phone]. This test typically takes about 30 minutes to perform.  Your provider has requested that you go to the basement level for lab work before leaving today. Press "B" on the elevator. The lab is located at the first door on the left as you exit the elevator.  Follow up with me on October 25, 2018 at 8:30 am.  Thank you for choosing me and [facility].   Amy Esterwood, PA-C

## 2018-10-11 ENCOUNTER — Encounter: Payer: Self-pay | Admitting: Physician Assistant

## 2018-10-11 NOTE — Progress Notes (Signed)
Subjective:    Patient ID: Natasha Foster, female    DOB: 1961/01/24, 58 y.o.   MRN: [id number]  HPI Natasha Foster is a pleasant 58 year old white female, established with Dr. Henrene Pastor who was last seen in June 2015 when she underwent screening colonoscopy which was a normal exam. She is self-referred today with complaints of sour sulfur he burps and diarrhea. She had onset of symptoms around Mother's Day initially thought that perhaps she had food poisoning.  However symptoms have persisted and she is now having episodes of onset of what she describes as "eggy" belching and burping followed by nausea queasiness, vomiting and then diarrhea.  Diarrhea will generally be multiple episodes eventually becoming watery.  She denies any pain with these episodes or cramping may have a sensation of bloating.  Usually these episodes last 1 day she may be a little bit weak the following day with decrease in appetite and then feels fine until a week or so later when she has another recurrence. She has made some significant adjustments in her diet starting carbonates, milk, sugars, Tea, and most greens.  Despite this she has not noticed much improvement in symptoms.  She has no history of IBS. She mentioned that she had wondered if her symptoms could be stress related and was somewhat tearful when she talked about this.  Her mother passed away about a year ago and she states she misses her greatly.  She is also been stressed from the [place] epidemic. She has started align, has been taking Prilosec 20 mg OTC every morning and was advised by our office to try FD guard 2-4 daily which she was taking until she was unable to find any at the pharmacy. No new medications, no travel, no antibiotics. She is status post hysterectomy and had a remote laparoscopy for endometriosis which was complicated by aortic injury  Review of Systems Pertinent positive and negative review of systems were noted in the above HPI section.  All other  review of systems was otherwise negative.  Outpatient Encounter Medications as of 10/10/2018  Medication Sig  . AMBULATORY NON FORMULARY MEDICATION 2-4 tablets as needed. FDGard  . dextromethorphan-guaiFENesin (MUCINEX DM) 30-600 MG 12hr tablet Take 1 tablet by mouth as needed.   . ergocalciferol (VITAMIN D2) 50000 UNITS capsule Take 50,000 Units by mouth as needed (every other week).   . fluticasone (FLONASE) 50 MCG/ACT nasal spray Place 2 sprays into both nostrils daily.   . Omeprazole Magnesium (PRILOSEC OTC PO) Take 1 tablet by mouth daily.  . Probiotic Product (ALIGN PO) Take 1 tablet by mouth daily.  . vitamin B-12 (CYANOCOBALAMIN) 1000 MCG tablet Take 1,000 mcg by mouth daily.  . hyoscyamine (LEVSIN SL) 0.125 MG SL tablet Place 1 tablet (0.125 mg total) under the tongue every 4 (four) hours as needed. PRN for episodes of burping and diarrhea.  . Loratadine (ALAVERT PO) Take 1 tablet by mouth as needed.    No facility-administered encounter medications on file as of 10/10/2018.    Allergies  Allergen Reactions  . Codeine Nausea And Vomiting  . Penicillins Rash   Patient Active Problem List   Diagnosis Date Noted  . Primary osteoarthritis of both first carpometacarpal joints 04/25/2018   Social History   Socioeconomic History  . Marital status: Married    Spouse name: Not on file  . Number of children: Not on file  . Years of education: Not on file  . Highest education level: Not on file  Occupational  History  . Not on file  Social Needs  . Financial resource strain: Not on file  . Food insecurity    Worry: Not on file    Inability: Not on file  . Transportation needs    Medical: Not on file    Non-medical: Not on file  Tobacco Use  . Smoking status: Never Smoker  . Smokeless tobacco: Never Used  Substance and Sexual Activity  . Alcohol use: Yes    Comment: rare  . Drug use: No  . Sexual activity: Not on file  Lifestyle  . Physical activity    Days per week:  Not on file    Minutes per session: Not on file  . Stress: Not on file  Relationships  . Social Herbalist on phone: Not on file    Gets together: Not on file    Attends religious service: Not on file    Active member of club or organization: Not on file    Attends meetings of clubs or organizations: Not on file    Relationship status: Not on file  . Intimate partner violence    Fear of current or ex partner: Not on file    Emotionally abused: Not on file    Physically abused: Not on file    Forced sexual activity: Not on file  Other Topics Concern  . Not on file  Social History Narrative  . Not on file    [name]'s family history is not on file.      Objective:    Vitals:   10/10/18 0832  BP: 124/78  Pulse: 81  Temp: 98.6 F (37 C)    Physical Exam; well-developed older white female in no acute distress, pleasant, height 5 foot 7, weight 186, BMI 29.1.  HEENT ;nontraumatic normocephalic EOMI PERRLA sclera anicteric, oropharynx not examined, wearing mask/COVID neck supple.  Cardiovascular ;regular rate and rhythm with S1-S2 no murmur rub or gallop.  Pulmonary; clear bilaterally.  Abdomen ;soft, there is no localized tenderness, no guarding or rebound, no palpable mass or hepatosplenomegaly, bowel sounds are present.  Rectal ;exam not done, Extremities; no clubbing cyanosis or edema skin warm and dry, Neuropsych; alert and oriented x4, grossly nonfocal, mood and affect appropriate       Assessment & Plan:   #[age] year old white female with 1 month history of recurrent episodes of sour belching and burping followed by nausea usually vomiting and diarrhea with multiple episodes of progressively more watery stools.  Episodes generally last about 24 hours and then gradually resolve.  Patient has no symptoms in between episodes.  She has had about 4 episodes over the past month.  Etiology is not clear, rule out gallbladder disease, rule out low-grade intermittent  obstruction, rule out IBS  #2 colon cancer surveillance-normal colonoscopy June 2015 #3 status post hysterectomy and laparoscopy secondary to endometriosis  Plan; start Prilosec 40 mg p.o. every morning. Start Levsin sublingual 1 p.o. every morning and every 4-6 hours as needed as needed for these episodes. We will schedule for upper abdominal ultrasound. CBC with differential, c-Met and sed rate. Continue align 1 p.o. daily   S  PA-C 10/11/2018   Cc: No ref. provider found

## 2018-10-12 LAB — HELICOBACTER PYLORI  ANTIBODY, IGM: H pylori, IgM Abs: <9 units (ref 0.0–8.9)

## 2018-10-13 ENCOUNTER — Telehealth: Payer: Self-pay | Admitting: Physician Assistant

## 2018-10-13 NOTE — Telephone Encounter (Signed)
Patient called for lab results.

## 2018-10-13 NOTE — Telephone Encounter (Signed)
Patient was notified of the results.  See results notes on labs for additional details.

## 2018-10-14 ENCOUNTER — Ambulatory Visit (HOSPITAL_COMMUNITY)
Admission: RE | Admit: 2018-10-14 | Discharge: 2018-10-14 | Disposition: A | Discharge status: Home / Self Care | Payer: BC Managed Care – PPO | Source: Ambulatory Visit | Attending: Physician Assistant | Admitting: Physician Assistant

## 2018-10-14 ENCOUNTER — Other Ambulatory Visit: Payer: Self-pay

## 2018-10-14 DIAGNOSIS — R142 Eructation: Secondary | ICD-10-CM | POA: Insufficient documentation

## 2018-10-14 DIAGNOSIS — R197 Diarrhea, unspecified: Secondary | ICD-10-CM

## 2018-10-14 DIAGNOSIS — R932 Abnormal findings on diagnostic imaging of liver and biliary tract: Secondary | ICD-10-CM | POA: Diagnosis not present

## 2018-10-14 DIAGNOSIS — R112 Nausea with vomiting, unspecified: Secondary | ICD-10-CM | POA: Diagnosis not present

## 2018-10-14 MED ORDER — LIDOCAINE HCL 1 % IJ SOLN
INTRAMUSCULAR | Status: AC
  Filled 2018-10-14: qty 10

## 2018-10-17 ENCOUNTER — Telehealth: Payer: Self-pay

## 2018-10-17 NOTE — Telephone Encounter (Signed)
Please call pt and let her know the [place] did not show any gallstones, she has a fatty liver and there  Is a small solid lesion in the liver- this may be benign ,but MRI of the Abdomen is recommended. Please schedule her for MRI of abdomen.

## 2018-10-17 NOTE — Telephone Encounter (Signed)
Received call report for pts [place], report in epic.

## 2018-10-17 NOTE — Progress Notes (Signed)
Assessment and plan reviewed 

## 2018-10-18 ENCOUNTER — Other Ambulatory Visit: Payer: Self-pay

## 2018-10-18 ENCOUNTER — Telehealth: Payer: Self-pay | Admitting: Internal Medicine

## 2018-10-18 DIAGNOSIS — K769 Liver disease, unspecified: Secondary | ICD-10-CM

## 2018-10-18 NOTE — Telephone Encounter (Signed)
Pt states she is very claustrophobic and is requesting Valium for upcoming MRI, Please advise.

## 2018-10-18 NOTE — Telephone Encounter (Signed)
Spoke with pt and she is aware of results. MRI of abd scheduled at [facility] 10/21/18@5pm , pt to arrive there at 4:30pm and be NPO after 1pm. Pt aware.

## 2018-10-18 NOTE — Telephone Encounter (Signed)
Patient return called stated she did not get to phone in time

## 2018-10-18 NOTE — Telephone Encounter (Signed)
Patient called in wanting to ask the doctor if he can prescribe a Volume for her mri that she has schedule for sat. She stated that she is very claustrophobic and that will help her. Please call and advise thanks.

## 2018-10-19 MED ORDER — DIAZEPAM 5 MG PO TABS: ORAL_TABLET | ORAL | 0 refills | Status: DC

## 2018-10-19 NOTE — Telephone Encounter (Signed)
Valium 5 mg 20 min prior to MRI. She needs someone to drive her if she takes valium. Thanks . By the way, was tearful during her OV with Amy.

## 2018-10-19 NOTE — Telephone Encounter (Signed)
Patient called said that she has a question regarding the report she was given yesterday. Also, has a question about a pill for her nerves.

## 2018-10-19 NOTE — Telephone Encounter (Signed)
Script sent to pharmacy, pt aware. 

## 2018-10-19 NOTE — Telephone Encounter (Signed)
Pt scheduled for MRI following up on liver lesion found on [place] ordered by Nicoletta Ba PA. Pt is calling to have valium prescribed to help her get through the MRI, patient very tearful while on the phone. Amy is at the hospital this week and I did not hear back from her yesterday. Please advise.

## 2018-10-21 ENCOUNTER — Ambulatory Visit (HOSPITAL_COMMUNITY): Payer: BC Managed Care – PPO

## 2018-10-21 ENCOUNTER — Ambulatory Visit: Payer: BLUE CROSS/BLUE SHIELD | Admitting: Internal Medicine

## 2018-10-22 ENCOUNTER — Ambulatory Visit (HOSPITAL_COMMUNITY)
Admission: RE | Admit: 2018-10-22 | Discharge: 2018-10-22 | Disposition: A | Discharge status: Home / Self Care | Payer: BC Managed Care – PPO | Source: Ambulatory Visit | Attending: Physician Assistant | Admitting: Physician Assistant

## 2018-10-22 ENCOUNTER — Other Ambulatory Visit: Payer: Self-pay

## 2018-10-22 DIAGNOSIS — K769 Liver disease, unspecified: Secondary | ICD-10-CM | POA: Insufficient documentation

## 2018-10-22 DIAGNOSIS — D1803 Hemangioma of intra-abdominal structures: Secondary | ICD-10-CM | POA: Diagnosis not present

## 2018-10-22 MED ORDER — GADOBUTROL 1 MMOL/ML IV SOLN
8.0000 mL | Freq: Once | INTRAVENOUS | Status: AC | PRN
  Administered 2018-10-22: 8 mL via INTRAVENOUS

## 2018-10-24 ENCOUNTER — Telehealth: Payer: Self-pay | Admitting: Physician Assistant

## 2018-10-24 NOTE — Telephone Encounter (Signed)
The pt advised that she will be called as soon as reviewed.   Amy FYI the pt states she is nervous and would like to know results.

## 2018-10-25 ENCOUNTER — Telehealth: Payer: Self-pay

## 2018-10-25 ENCOUNTER — Encounter: Payer: Self-pay | Admitting: Physician Assistant

## 2018-10-25 ENCOUNTER — Ambulatory Visit: Payer: BC Managed Care – PPO | Admitting: Physician Assistant

## 2018-10-25 VITALS — BP 126/82 | HR 73 | Temp 99.0°F | Ht 67.0 in | Wt 185.4 lb

## 2018-10-25 DIAGNOSIS — K589 Irritable bowel syndrome without diarrhea: Secondary | ICD-10-CM | POA: Diagnosis not present

## 2018-10-25 DIAGNOSIS — D1803 Hemangioma of intra-abdominal structures: Secondary | ICD-10-CM

## 2018-10-25 DIAGNOSIS — R112 Nausea with vomiting, unspecified: Secondary | ICD-10-CM | POA: Diagnosis not present

## 2018-10-25 NOTE — Progress Notes (Signed)
Subjective:    Patient ID: Natasha Foster, female    DOB: [birth year], 58 y.o.   MRN: [id number]  HPI Natasha Foster is a pleasant 58 year old white female, established with Dr. Henrene Pastor who was seen in the office by myself on 10/10/2018.  She comes in today for follow-up. She had complaint of 1 month history of recurrent episodes of sour belching and burping followed by nausea and occasional vomiting.  It also had some diarrhea with these episodes.  She had reported that she had no symptoms in between the episodes but had had about 4 occurrences prior to that office visit.  It was felt most likely she had IBS had admitted to a lot of anxiety recently. Baseline labs were done which were unremarkable. She underwent upper abdominal ultrasound which showed slight increased hepatic echogenicity as can be seen with steatosis and a 2.2 cm heterogeneous solid-appearing mass in the right hepatic lobe for which MRI was recommended for further evaluation, there were no gallstones.  She had the MRI on 10/22/2018 really did not show any evidence of steatosis.  The 2.2 cm lesion was found to be a benign hemangioma in segment 8 There were a few tiny subcentimeter cysts noted in the left hepatic lobe, gallbladder was unremarkable no ductal dilation, and remainder of study unremarkable.  Patient is feeling better.  She says she has been very careful with her diet and trying to avoid acidic and gassy foods and is gradually attempting to advance her diet.  Since she was seen in the office she is only had one bout of diarrhea over Father's Day weekend bowels have otherwise been normal.  The sour belching burping and nausea have resolved. She has been taking omeprazole 20 mg p.o. every morning, and is taking align daily.   Review of Systems Pertinent positive and negative review of systems were noted in the above HPI section.  All other review of systems was otherwise negative.  Outpatient Encounter Medications as of 10/25/2018   Medication Sig  . ergocalciferol (VITAMIN D2) 50000 UNITS capsule Take 50,000 Units by mouth as needed (every other week).   . Omeprazole Magnesium (PRILOSEC OTC PO) Take 1 tablet by mouth daily. At 8am  . Probiotic Product (ALIGN PO) Take 1 tablet by mouth daily.  . vitamin B-12 (CYANOCOBALAMIN) 1000 MCG tablet Take 1,000 mcg by mouth daily.  . AMBULATORY NON FORMULARY MEDICATION 2-4 tablets as needed. FDGard  . dextromethorphan-guaiFENesin (MUCINEX DM) 30-600 MG 12hr tablet Take 1 tablet by mouth as needed.   . fluticasone (FLONASE) 50 MCG/ACT nasal spray Place 2 sprays into both nostrils daily.   . hyoscyamine (LEVSIN SL) 0.125 MG SL tablet Place 1 tablet (0.125 mg total) under the tongue every 4 (four) hours as needed. PRN for episodes of burping and diarrhea. (Patient not taking: Reported on 10/25/2018)  . Loratadine (ALAVERT PO) Take 1 tablet by mouth as needed.   . [DISCONTINUED] diazepam (VALIUM) 5 MG tablet Take tablet by mouth 20 minutes prior to MRI   No facility-administered encounter medications on file as of 10/25/2018.    Allergies  Allergen Reactions  . Codeine Nausea And Vomiting  . Penicillins Rash   Patient Active Problem List   Diagnosis Date Noted  . Primary osteoarthritis of both first carpometacarpal joints 04/25/2018   Social History   Socioeconomic History  . Marital status: Married    Spouse name: Not on file  . Number of children: Not on file  . Years of education: Not  on file  . Highest education level: Not on file  Occupational History  . Not on file  Social Needs  . Financial resource strain: Not on file  . Food insecurity    Worry: Not on file    Inability: Not on file  . Transportation needs    Medical: Not on file    Non-medical: Not on file  Tobacco Use  . Smoking status: Never Smoker  . Smokeless tobacco: Never Used  Substance and Sexual Activity  . Alcohol use: Yes    Comment: rare  . Drug use: No  . Sexual activity: Not on file   Lifestyle  . Physical activity    Days per week: Not on file    Minutes per session: Not on file  . Stress: Not on file  Relationships  . Social Herbalist on phone: Not on file    Gets together: Not on file    Attends religious service: Not on file    Active member of club or organization: Not on file    Attends meetings of clubs or organizations: Not on file    Relationship status: Not on file  . Intimate partner violence    Fear of current or ex partner: Not on file    Emotionally abused: Not on file    Physically abused: Not on file    Forced sexual activity: Not on file  Other Topics Concern  . Not on file  Social History Narrative  . Not on file    Ms. Profitt's family history is not on file.      Objective:    Vitals:   10/25/18 0836  BP: 126/82  Pulse: 73  Temp: 99 F (37.2 C)    Physical Exam;Well-developed well-nourished white female in no acute distress.   BMI 29.03  HEENT; nontraumatic normocephalic, EOMI, PE R LA, sclera anicteric. Oropharynx; not examined/wearing mask/COVID  Skin; benign exam, no jaundice rash or appreciable lesions Extremities; no clubbing cyanosis or edema skin warm and dry Neuro/Psych; alert and oriented x4, grossly nonfocal mood and affect appropriate            IMP/PLAN; #[age] year old white female with recent history of recurrent episodes of sour belching burping nausea and diarrhea which had been episodic over the prior month or so. Most of the symptoms have resolved. Abdominal imaging was unremarkable of the abdomen and ultrasound as to etiology for the symptoms. Symptoms are most consistent with IBS likely exacerbated by underlying recent stress/anxiety. She also has a component of GERD which is improved on omeprazole 20 mg daily  #2 hepatic lesion on ultrasound-proven to be a 2.2 cm hepatic hemangioma on MRI #3 slight hepatic steatosis on ultrasound not confirmed on MRI #4 colon cancer screening-negative  colonoscopy 2015  Plan; she will continue omeprazole 20 mg p.o. every morning through August 2020 and may trial off omeprazole.  She is advised if symptoms recur to resume this low dose of PPI. She will gradually advance her diet.  She was given a copy of low gas diet today to help her choose. Continue align 1 daily.  She has a prescription for Levsin sublingual to use PRN for diarrhea but has not required. She was concerned about fatty liver she really does not have any significant steatosis on MRI at all.  She has been working with weight loss, diet and exercise.  She was encouraged to continue her healthy habits. She will follow-up with Dr. Henrene Pastor or myself  on an as-needed basis.  Eagan Shifflett S Jaeley Wiker PA-C 10/25/2018   Cc: No ref. provider found

## 2018-10-25 NOTE — Telephone Encounter (Signed)
Covid-19 screening questions   Do you now or have you had a fever in the last 14 days? No  Do you have any respiratory symptoms of shortness of breath or cough now or in the last 14 days? No  Do you have any family members or close contacts with diagnosed or suspected Covid-19 in the past 14 days? No  Have you been tested for Covid-19 and found to be positive? No        

## 2018-10-25 NOTE — Patient Instructions (Addendum)
If you are age 60 or older, your body mass index should be between 23-30. Your Body mass index is 29.03 kg/m. If this is out of the aforementioned range listed, please consider follow up with your Primary Care Provider.  If you are age 59 or younger, your body mass index should be between 19-25. Your Body mass index is 29.03 kg/m. If this is out of the aformentioned range listed, please consider follow up with your Primary Care Provider.   Continue Prilosec for two months.  You can stop after that if you wish.  Continue Align.  You have been given a low gas diet.  Follow up with me if needed.  Thank you for choosing me and [facility].   Amy Esterwood, PA-c

## 2018-10-25 NOTE — Progress Notes (Signed)
Reviewed

## 2019-01-19 ENCOUNTER — Other Ambulatory Visit: Payer: Self-pay

## 2019-01-19 DIAGNOSIS — Z20822 Contact with and (suspected) exposure to covid-19: Secondary | ICD-10-CM

## 2019-01-20 LAB — NOVEL CORONAVIRUS, NAA: SARS-CoV-2, NAA: NOT DETECTED

## 2019-07-06 ENCOUNTER — Ambulatory Visit
Admission: RE | Admit: 2019-07-06 | Discharge: 2019-07-06 | Disposition: A | Discharge status: Home / Self Care | Payer: BC Managed Care – PPO | Source: Ambulatory Visit | Attending: Family Medicine | Admitting: Family Medicine

## 2019-07-06 ENCOUNTER — Other Ambulatory Visit: Payer: Self-pay | Admitting: Family Medicine

## 2019-07-06 DIAGNOSIS — Z1231 Encounter for screening mammogram for malignant neoplasm of breast: Secondary | ICD-10-CM | POA: Diagnosis not present

## 2020-02-09 DIAGNOSIS — Z20822 Contact with and (suspected) exposure to covid-19: Secondary | ICD-10-CM | POA: Diagnosis not present

## 2020-04-01 IMAGING — MR MRI ABDOMEN WITH AND WITHOUT CONTRAST
17 series · 48 of 48 positions shown · IV contrast (Contrast agent)
Comparison: Abdomen ultrasound on 10/14/2018

CLINICAL DATA: Indeterminate liver mass and probable hepatic
steatosis on recent ultrasound.

EXAM:
MRI ABDOMEN WITHOUT AND WITH CONTRAST
TECHNIQUE: Multiplanar multisequence MR imaging of the abdomen was performed
both before and after the administration of intravenous contrast.
CONTRAST:  8 mL Gadavist

[Series 4: cor haste · coronal · 6.0mm · 1.34mm/px · 2 of 30 slices shown]
[im 1/30]
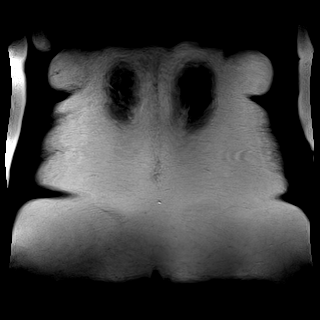
[im 30/30]
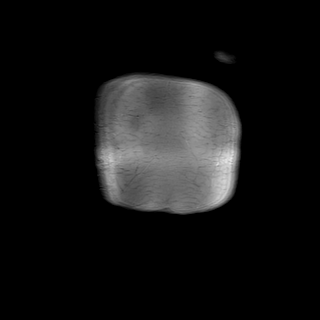

[Series 5: ax haste · axial · 6.0mm · 1.28mm/px · z∈[+31,+239]mm · 2 of 30 slices shown]
[im 1/30]
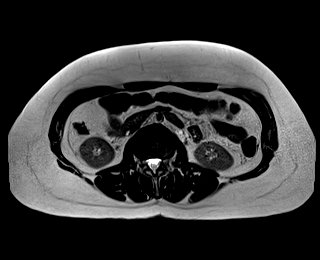
[im 30/30]
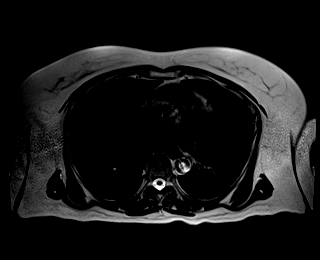

[Series 6: T2 fat-sat · axial · 6.0mm · 1.28mm/px · z∈[+31,+240]mm · 2 of 30 slices shown]
[im 1/30]
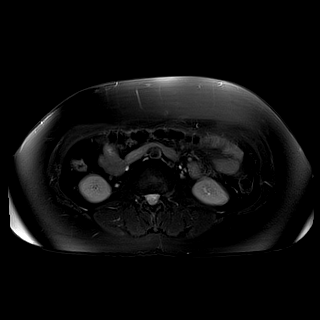
[im 30/30]
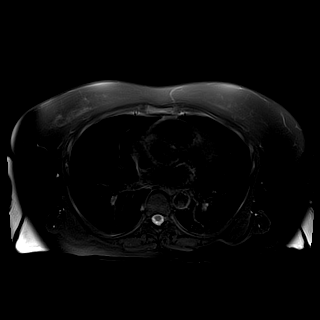

[Series 7: t1_vibe_opp-in_tra_p4_bh · axial · 3.0mm · 1.31mm/px · z∈[+27,+240]mm · 6 of 144 slices shown]
[im 1/144]
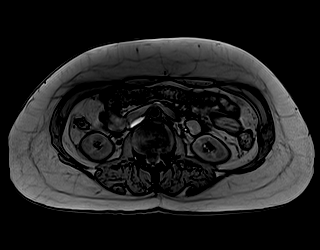
[im 29/144]
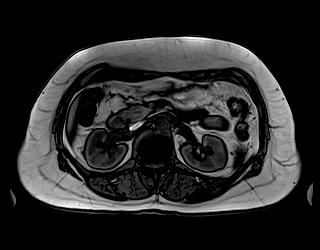
[im 58/144]
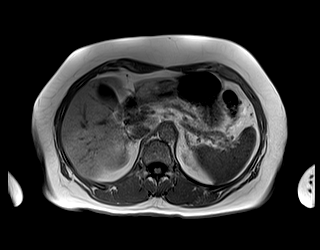
[im 86/144]
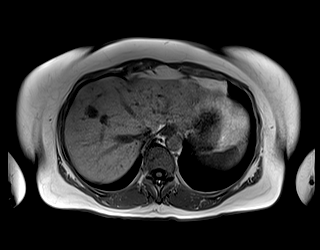
[im 115/144]
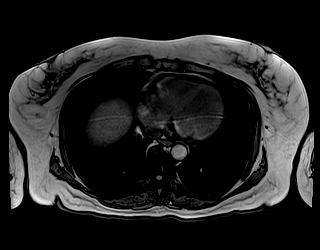
[im 144/144]
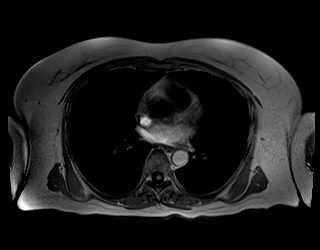

[Series 8: DWI · axial · 6.0mm · 1.57mm/px · z∈[+31,+240]mm · 4 of 90 slices shown (1 of 2)]
[im 1/90]
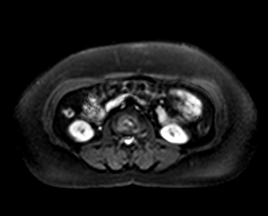
[im 30/90]
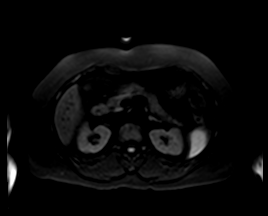
[im 60/90]
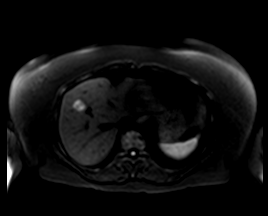
[im 90/90]
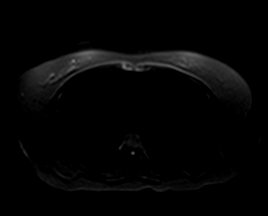

[Series 9: DWI · axial · 6.0mm · 1.57mm/px · 1 of 30 slices shown (2 of 2)]
[im 1/30]
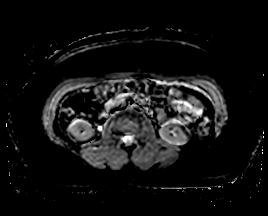

[Series 10: bSSFP · axial · 6.0mm · 0.82mm/px · 1 of 30 slices shown]
[im 1/30]
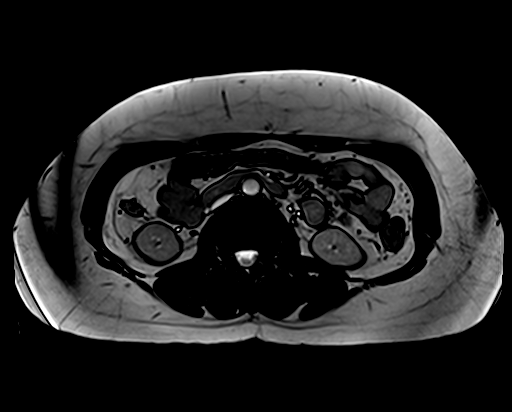

[Series 11: t1_vibe_fs_tra_p4_bh_pre · axial · 3.0mm · 1.31mm/px · z∈[+27,+240]mm · 3 of 72 slices shown]
[im 1/72]
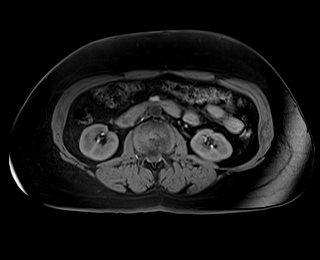
[im 36/72]
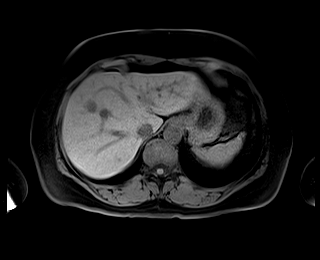
[im 72/72]
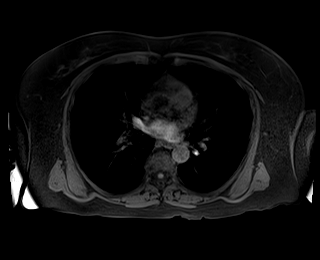

[Series 13: t1_vibe_fs_tra_p4_bh_post · axial · 3.0mm · 1.31mm/px · z∈[+27,+240]mm · 3 of 72 slices shown (1 of 4)]
[im 1/72]
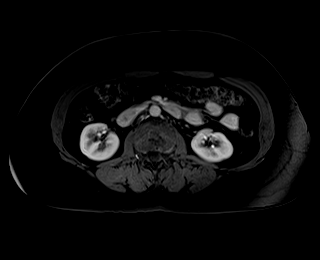
[im 36/72]
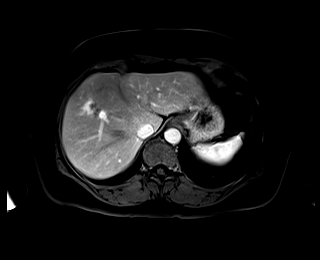
[im 72/72]
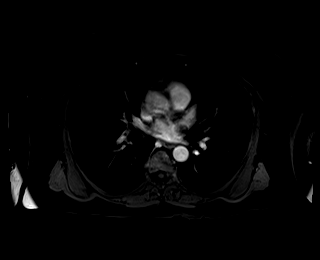

[Series 14: t1_vibe_fs_tra_p4_bh_post_sub · axial · 3.0mm · 1.31mm/px · z∈[+27,+240]mm · 3 of 72 slices shown (1 of 4)]
[im 1/72]
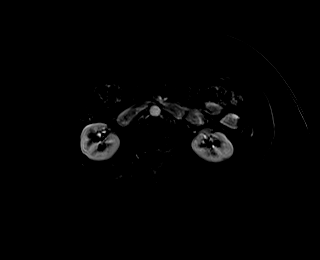
[im 36/72]
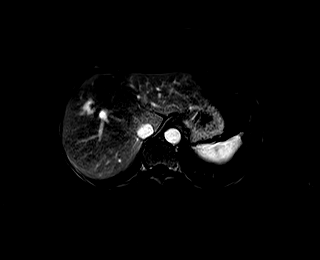
[im 72/72]
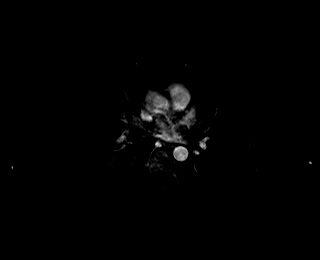

[Series 15: t1_vibe_fs_tra_p4_bh_post · axial · 3.0mm · 1.31mm/px · z∈[+27,+240]mm · 3 of 72 slices shown (2 of 4)]
[im 1/72]
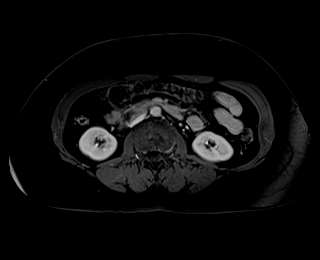
[im 36/72]
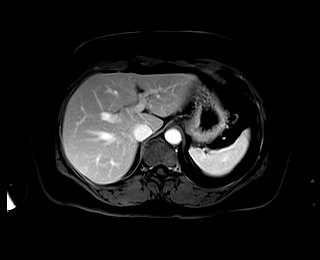
[im 72/72]
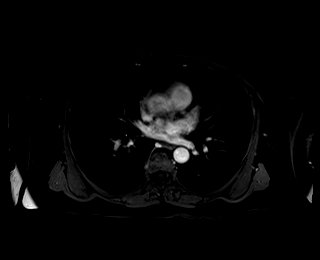

[Series 16: t1_vibe_fs_tra_p4_bh_post_sub · axial · 3.0mm · 1.31mm/px · z∈[+27,+240]mm · 3 of 72 slices shown (2 of 4)]
[im 1/72]
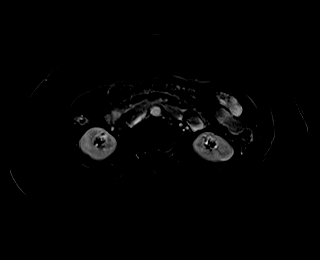
[im 36/72]
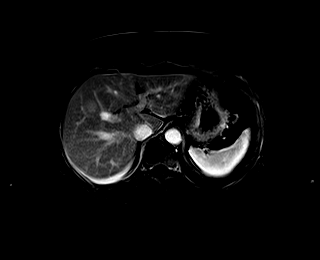
[im 72/72]
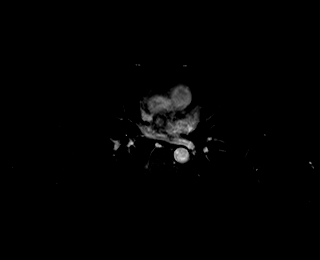

[Series 17: t1_vibe_fs_tra_p4_bh_post · axial · 3.0mm · 1.31mm/px · z∈[+27,+240]mm · 3 of 72 slices shown (3 of 4)]
[im 1/72]
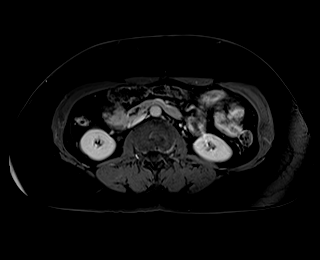
[im 36/72]
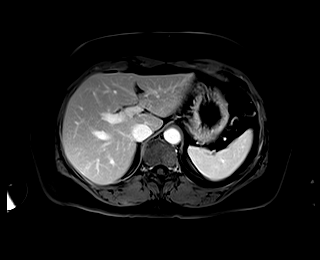
[im 72/72]
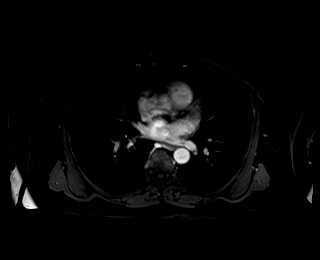

[Series 18: t1_vibe_fs_tra_p4_bh_post_sub · axial · 3.0mm · 1.31mm/px · z∈[+27,+240]mm · 3 of 72 slices shown (3 of 4)]
[im 1/72]
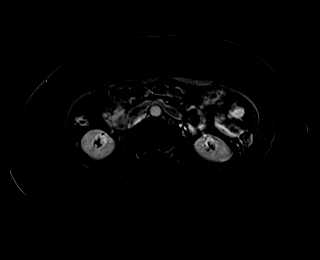
[im 36/72]
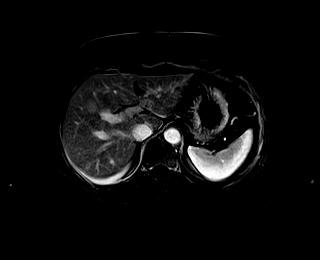
[im 72/72]
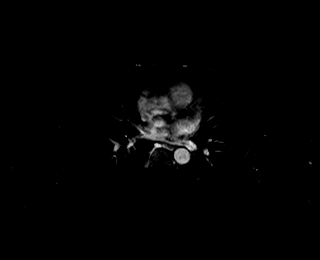

[Series 19: t1_vibe_fs_tra_p4_bh_post · axial · 3.0mm · 1.31mm/px · z∈[+27,+240]mm · 3 of 72 slices shown (4 of 4)]
[im 1/72]
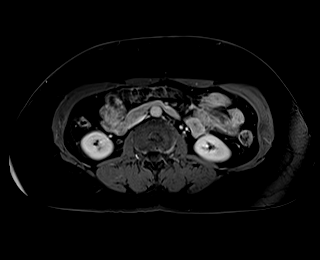
[im 36/72]
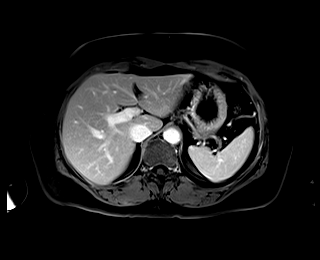
[im 72/72]
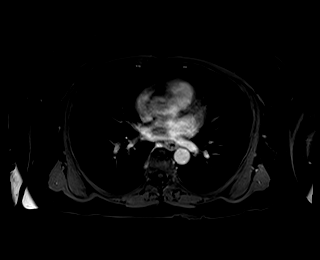

[Series 20: t1_vibe_fs_tra_p4_bh_post_sub · axial · 3.0mm · 1.31mm/px · z∈[+27,+240]mm · 3 of 72 slices shown (4 of 4)]
[im 1/72]
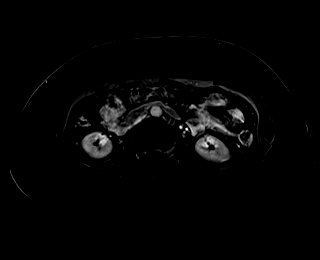
[im 36/72]
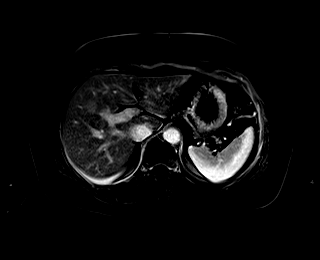
[im 72/72]
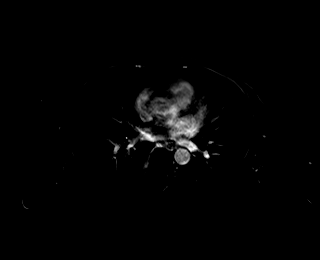

[Series 21: T1 dynamic post-contrast · coronal · 3.0mm · 1.31mm/px · 3 of 72 slices shown]
[im 1/72]
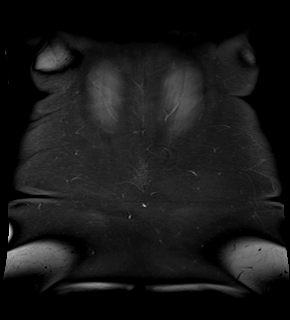
[im 36/72]
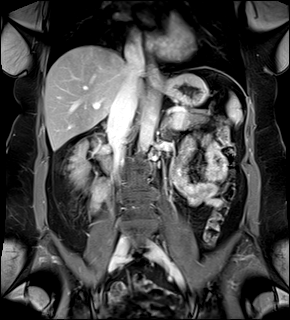
[im 72/72]
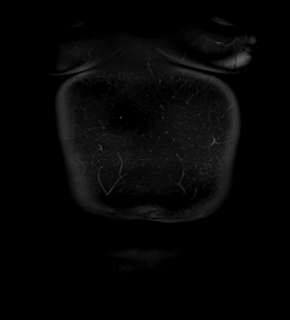

[48 of 48 positions shown; findings below may reference images not displayed]

FINDINGS: Lower chest: No acute findings.

Hepatobiliary: No evidence of steatosis seen on chemical shift
imaging. A 2.2 cm benign hemangioma is seen in segment 8, which
corresponds to the hepatic mass seen on recent ultrasound. In
addition, there are a few tiny sub-cm cysts noted the left hepatic
lobe. Gallbladder is unremarkable. No evidence of biliary ductal
dilatation.

Pancreas:  No mass or inflammatory changes.

Spleen:  Within normal limits in size and appearance.

Adrenals/Urinary Tract: No masses identified. No evidence of
hydronephrosis.

Stomach/Bowel: Visualized portion unremarkable.

Vascular/Lymphatic: No pathologically enlarged lymph nodes
identified. No abdominal aortic aneurysm.

Other:  None.

Musculoskeletal:  No suspicious bone lesions identified.
IMPRESSION: 2.2 cm benign hemangioma in the right, which corresponds to the mass
seen on recent ultrasound. No other significant abnormality.

## 2020-07-06 DIAGNOSIS — J329 Chronic sinusitis, unspecified: Secondary | ICD-10-CM | POA: Diagnosis not present

## 2020-08-01 DIAGNOSIS — R7989 Other specified abnormal findings of blood chemistry: Secondary | ICD-10-CM | POA: Diagnosis not present

## 2020-08-01 DIAGNOSIS — Z13220 Encounter for screening for lipoid disorders: Secondary | ICD-10-CM | POA: Diagnosis not present

## 2020-08-01 DIAGNOSIS — E559 Vitamin D deficiency, unspecified: Secondary | ICD-10-CM | POA: Diagnosis not present

## 2020-08-01 DIAGNOSIS — E538 Deficiency of other specified B group vitamins: Secondary | ICD-10-CM | POA: Diagnosis not present

## 2020-08-01 DIAGNOSIS — Z0000 Encounter for general adult medical examination without abnormal findings: Secondary | ICD-10-CM | POA: Diagnosis not present

## 2020-11-05 ENCOUNTER — Telehealth: Payer: Self-pay | Admitting: Dermatology

## 2020-11-05 NOTE — Telephone Encounter (Addendum)
Patient wants to talk with someone about lesion on back.  She has an appointment 11/13/2020 with Robyne Askew, PA-C.  I told patient it could be up to 2 business days before received a call back.  Patient's last office visit was 2020.  Patient called back to say that she is going to go to her PCP, but still wants the 11/13/2020 appointment.

## 2020-11-05 NOTE — Telephone Encounter (Signed)
Phone call to patient to see what I can help her with. Per patient she has a cyst on her back that she's had for year that Dr. Denna Haggard and Vida Roller have been watching. Per patient the cyst is now red, itching inflamed and tender. Patient wanted to know if she needed an antibiotic?  I informed patient that I'm unable to say if she'll need an antibiotic. Patient wanted to know if she could go see her PCP since we don't have any openings until next week.  I informed patient that she can go see her PCP.  Patient aware.

## 2020-11-07 DIAGNOSIS — L723 Sebaceous cyst: Secondary | ICD-10-CM | POA: Diagnosis not present

## 2020-11-13 ENCOUNTER — Ambulatory Visit: Payer: BC Managed Care – PPO | Admitting: Physician Assistant

## 2020-11-13 ENCOUNTER — Other Ambulatory Visit: Payer: Self-pay

## 2020-11-13 DIAGNOSIS — L729 Follicular cyst of the skin and subcutaneous tissue, unspecified: Secondary | ICD-10-CM

## 2020-11-13 DIAGNOSIS — L720 Epidermal cyst: Secondary | ICD-10-CM

## 2020-11-13 MED ORDER — TRIAMCINOLONE ACETONIDE 10 MG/ML IJ SUSP
10.0000 mg | Freq: Once | INTRAMUSCULAR | Status: AC
  Administered 2020-11-13: 10 mg

## 2020-11-14 ENCOUNTER — Encounter: Payer: Self-pay | Admitting: Physician Assistant

## 2020-11-14 NOTE — Progress Notes (Signed)
   Follow-Up Visit   Subjective  Natasha Foster is a 60 y.o. female who presents for the following: Skin Problem (Here for cyst on back. It is painful and very irritated.  She went to PCP last week where they drained it and gave her doxycycline bid x 7 days. Patient has 3 pills. ). It is still extremely painful.   The following portions of the chart were reviewed this encounter and updated as appropriate:  Tobacco  Allergies  Meds  Problems  Med Hx  Surg Hx  Fam Hx      Objective  Well appearing patient in no apparent distress; mood and affect are within normal limits.  A focused examination was performed including back. Relevant physical exam findings are noted in the Assessment and Plan.  Right Upper Back 4 cm dense red nodule with central opening.      Assessment & Plan  Ruptured epidermal cyst Right Upper Back  Incision and Drainage - Right Upper Back Location: back  Informed Consent: Discussed risks (permanent scarring, light or dark discoloration, infection, pain, bleeding, bruising, redness, damage to adjacent structures, and recurrence of the lesion) and benefits of the procedure, as well as the alternatives.  Informed consent was obtained.  Preparation: The area was prepped with alcohol.  Anesthesia: Lidocaine 2% with epinephrine  Procedure Details: An incision was made overlying the lesion. The lesion drained blood, serosanguinous fluid and 2 small pieces of the pearly white sac.   A small amount of fluid was drained.    Antibiotic ointment and a sterile pressure dressing were applied. The patient tolerated procedure well.  Total number of lesions drained: 1  Plan: The patient was instructed on post-op care. Recommend OTC analgesia as needed for pain.   Intralesional injection - Right Upper Back Location: upper back  Informed Consent: Discussed risks (infection, pain, bleeding, bruising, thinning of the skin, loss of skin pigment, lack of resolution, and  recurrence of lesion) and benefits of the procedure, as well as the alternatives. Informed consent was obtained. Preparation: The area was prepared a standard fashion.  Anesthesia:Lidocaine 2% with epinephrine  Procedure Details: An intralesional injection was performed with Kenalog 10 mg/cc. 1 cc in total were injected.  Total number of injections: 3  Plan: The patient was instructed on post-op care. Recommend OTC analgesia as needed for pain.   Related Medications triamcinolone acetonide (KENALOG) 10 MG/ML injection 10 mg     I, Lindley Hiney, PA-C, have reviewed all documentation's for this visit.  The documentation on 11/14/20 for the exam, diagnosis, procedures and orders are all accurate and complete.

## 2021-03-06 ENCOUNTER — Encounter: Payer: Self-pay | Admitting: Physician Assistant

## 2021-03-06 ENCOUNTER — Ambulatory Visit (INDEPENDENT_AMBULATORY_CARE_PROVIDER_SITE_OTHER): Payer: BC Managed Care – PPO | Admitting: Physician Assistant

## 2021-03-06 ENCOUNTER — Other Ambulatory Visit: Payer: Self-pay

## 2021-03-06 DIAGNOSIS — L720 Epidermal cyst: Secondary | ICD-10-CM | POA: Diagnosis not present

## 2021-03-10 ENCOUNTER — Encounter: Payer: Self-pay | Admitting: Physician Assistant

## 2021-03-10 NOTE — Progress Notes (Signed)
   Follow-Up Visit   Subjective  Natasha Foster is a 60 y.o. female who presents for the following: Procedure (Here for cyst removal on back- doesn't think she wants it done).   The following portions of the chart were reviewed this encounter and updated as appropriate:  Tobacco  Allergies  Meds  Problems  Med Hx  Surg Hx  Fam Hx      Objective  Well appearing patient in no apparent distress; mood and affect are within normal limits.  A focused examination was performed including back. Relevant physical exam findings are noted in the Assessment and Plan.  right upper back Highly vascular divot where the irritated cyst was drained.       Assessment & Plan  Epidermal cyst right upper back  Deferred-surgery.   Return to the clinic if recurs.   I, Nera Haworth, PA-C, have reviewed all documentation's for this visit.  The documentation on 03/10/21 for the exam, diagnosis, procedures and orders are all accurate and complete.

## 2021-06-16 ENCOUNTER — Ambulatory Visit: Payer: BC Managed Care – PPO | Admitting: Dermatology

## 2021-06-16 ENCOUNTER — Other Ambulatory Visit: Payer: Self-pay

## 2021-06-16 DIAGNOSIS — L720 Epidermal cyst: Secondary | ICD-10-CM

## 2021-06-16 DIAGNOSIS — Z1283 Encounter for screening for malignant neoplasm of skin: Secondary | ICD-10-CM | POA: Diagnosis not present

## 2021-06-16 DIAGNOSIS — D235 Other benign neoplasm of skin of trunk: Secondary | ICD-10-CM | POA: Diagnosis not present

## 2021-06-16 DIAGNOSIS — D239 Other benign neoplasm of skin, unspecified: Secondary | ICD-10-CM

## 2021-06-18 ENCOUNTER — Encounter: Payer: Self-pay | Admitting: Dermatology

## 2021-06-18 NOTE — Progress Notes (Signed)
° °  Follow-Up Visit   Subjective  Natasha Foster is a 61 y.o. female who presents for the following: Annual Exam (Pt here for annual. Pt has no other concerns).  General skin examination, history of melanoma Location:  Duration:  Quality:  Associated Signs/Symptoms: Modifying Factors:  Severity:  Timing: Context:   Objective  Well appearing patient in no apparent distress; mood and affect are within normal limits. Scalp General skin examination: No atypical pigmented spots.  No recurrence of melanoma (2004).  No sign nonmelanoma skin cancer.  Gluteal Crease Monochrome time 6 mm slightly raised papule  Right Upper Back Noninflamed 9 mm dermal papule    A full examination was performed including scalp, head, eyes, ears, nose, lips, neck, chest, axillae, abdomen, back, buttocks, bilateral upper extremities, bilateral lower extremities, hands, feet, fingers, toes, fingernails, and toenails. All findings within normal limits unless otherwise noted below.  Areas beneath undergarments not fully examined.   Assessment & Plan    Encounter for screening for malignant neoplasm of skin Scalp  Annual skin examination.  Encouraged to self examine with spouse twice annually.  Continue ultraviolet protection.  Dermal nevus Gluteal Crease  Recheck as needed change  Epidermal cyst Right Upper Back  I discussed elective excision, for now patient chooses to leave.      I, Lavonna Monarch, MD, have reviewed all documentation for this visit.  The documentation on 06/18/21 for the exam, diagnosis, procedures, and orders are all accurate and complete.

## 2021-08-20 ENCOUNTER — Other Ambulatory Visit: Payer: Self-pay | Admitting: Family Medicine

## 2021-08-20 DIAGNOSIS — Z1231 Encounter for screening mammogram for malignant neoplasm of breast: Secondary | ICD-10-CM

## 2021-08-21 ENCOUNTER — Ambulatory Visit
Admission: RE | Admit: 2021-08-21 | Discharge: 2021-08-21 | Disposition: A | Discharge status: Home / Self Care | Payer: BC Managed Care – PPO | Source: Ambulatory Visit | Attending: Family Medicine | Admitting: Family Medicine

## 2021-08-21 DIAGNOSIS — Z1231 Encounter for screening mammogram for malignant neoplasm of breast: Secondary | ICD-10-CM | POA: Diagnosis not present

## 2021-08-22 ENCOUNTER — Other Ambulatory Visit: Payer: Self-pay | Admitting: Family Medicine

## 2021-08-22 DIAGNOSIS — R928 Other abnormal and inconclusive findings on diagnostic imaging of breast: Secondary | ICD-10-CM

## 2021-08-28 ENCOUNTER — Ambulatory Visit: Payer: BC Managed Care – PPO

## 2021-08-28 ENCOUNTER — Ambulatory Visit
Admission: RE | Admit: 2021-08-28 | Discharge: 2021-08-28 | Disposition: A | Discharge status: Home / Self Care | Payer: BC Managed Care – PPO | Source: Ambulatory Visit | Attending: Family Medicine | Admitting: Family Medicine

## 2021-08-28 DIAGNOSIS — N6489 Other specified disorders of breast: Secondary | ICD-10-CM | POA: Diagnosis not present

## 2021-08-28 DIAGNOSIS — R928 Other abnormal and inconclusive findings on diagnostic imaging of breast: Secondary | ICD-10-CM

## 2021-08-30 ENCOUNTER — Other Ambulatory Visit: Payer: BC Managed Care – PPO

## 2022-03-24 DIAGNOSIS — R7989 Other specified abnormal findings of blood chemistry: Secondary | ICD-10-CM | POA: Diagnosis not present

## 2022-03-24 DIAGNOSIS — E7800 Pure hypercholesterolemia, unspecified: Secondary | ICD-10-CM | POA: Diagnosis not present

## 2022-03-24 DIAGNOSIS — E538 Deficiency of other specified B group vitamins: Secondary | ICD-10-CM | POA: Diagnosis not present

## 2022-03-24 DIAGNOSIS — Z23 Encounter for immunization: Secondary | ICD-10-CM | POA: Diagnosis not present

## 2022-03-24 DIAGNOSIS — Z0000 Encounter for general adult medical examination without abnormal findings: Secondary | ICD-10-CM | POA: Diagnosis not present

## 2022-03-24 DIAGNOSIS — E559 Vitamin D deficiency, unspecified: Secondary | ICD-10-CM | POA: Diagnosis not present

## 2022-06-29 ENCOUNTER — Ambulatory Visit: Payer: BC Managed Care – PPO | Admitting: Dermatology

## 2022-11-23 DIAGNOSIS — B085 Enteroviral vesicular pharyngitis: Secondary | ICD-10-CM | POA: Diagnosis not present

## 2022-11-23 DIAGNOSIS — J0100 Acute maxillary sinusitis, unspecified: Secondary | ICD-10-CM | POA: Diagnosis not present

## 2023-09-24 ENCOUNTER — Ambulatory Visit
Admission: RE | Admit: 2023-09-24 | Discharge: 2023-09-24 | Disposition: A | Discharge status: Home / Self Care | Source: Ambulatory Visit | Attending: Family Medicine | Admitting: Family Medicine

## 2023-09-24 ENCOUNTER — Other Ambulatory Visit: Payer: Self-pay | Admitting: Family Medicine

## 2023-09-24 DIAGNOSIS — Z1231 Encounter for screening mammogram for malignant neoplasm of breast: Secondary | ICD-10-CM | POA: Diagnosis not present

## 2023-10-11 ENCOUNTER — Ambulatory Visit: Admitting: Orthopedic Surgery

## 2023-10-11 ENCOUNTER — Other Ambulatory Visit (INDEPENDENT_AMBULATORY_CARE_PROVIDER_SITE_OTHER): Payer: Self-pay

## 2023-10-11 ENCOUNTER — Encounter: Payer: Self-pay | Admitting: Orthopedic Surgery

## 2023-10-11 DIAGNOSIS — M25561 Pain in right knee: Secondary | ICD-10-CM

## 2023-10-11 DIAGNOSIS — M1711 Unilateral primary osteoarthritis, right knee: Secondary | ICD-10-CM | POA: Diagnosis not present

## 2023-10-11 DIAGNOSIS — G8929 Other chronic pain: Secondary | ICD-10-CM

## 2023-10-11 MED ORDER — LIDOCAINE HCL (PF) 1 % IJ SOLN: 5.0000 mL | INTRAMUSCULAR | Status: AC | PRN

## 2023-10-11 MED ORDER — METHYLPREDNISOLONE ACETATE 40 MG/ML IJ SUSP
40.0000 mg | INTRAMUSCULAR | Status: AC | PRN
  Administered 2023-10-11: 40 mg via INTRA_ARTICULAR

## 2023-10-11 NOTE — Progress Notes (Signed)
 Office Visit Note   Patient: Natasha Foster           Date of Birth: 1960/09/09           MRN: [id number] Visit Date: 10/11/2023              Requested by: Ransom Byers, MD [address] [place],  [place] [address] PCP: Ransom Byers, MD  Chief Complaint  Patient presents with   Right Knee - Pain    Wants cortisone injection      HPI: Patient is a 63 year old woman who is seen for initial evaluation for osteoarthritis right knee.  Patient states she previously had a meniscal tear in her left knee which was treated nonoperatively.  Patient states she has had difficulty bending the right knee she has been using ice and rest.  Assessment & Plan: Visit Diagnoses:  1. Chronic pain of right knee   2. Unilateral primary osteoarthritis, right knee     Plan: Right knee was injected she tolerated this well discussed that we could proceed with additional injections in 3 to 4 months if she is symptomatic.  Discussed the possibility of a hyaluronic acid injection.  Discussed total knee arthroplasty treatment options.  Follow-Up Instructions: Return if symptoms worsen or fail to improve.   Ortho Exam  Patient is alert, oriented, no adenopathy, well-dressed, normal affect, normal respiratory effort. Examination the right knee there is no effusion she is tender palpation medial joint line collaterals and cruciates are stable.  There is crepitation with range of motion.  She has varus alignment with standing.    Imaging: XR Knee 1-2 Views Right Result Date: 10/11/2023 2 view radiographs of the right knee shows tricompartmental arthritic changes with bone-on-bone contact medial joint line with osteophytic bone spurs in all 3 compartments with periarticular cyst and sclerotic changes.  No images are attached to the encounter.  Labs: Lab Results  Component Value Date   ESRSEDRATE 12 10/10/2018     Lab Results  Component Value Date   ALBUMIN 4.2 10/10/2018     No results found for: MG No results found for: VD25OH  No results found for: PREALBUMIN    Latest Ref Rng & Units 10/10/2018    9:47 AM  CBC EXTENDED  WBC 4.0 - 10.5 K/uL 4.4   RBC 3.87 - 5.11 Mil/uL 4.38   Hemoglobin 12.0 - 15.0 g/dL 02.7   HCT 25.3 - 66.4 % 37.6   Platelets 150.0 - 400.0 K/uL 256.0   NEUT# 1.4 - 7.7 K/uL 2.3   Lymph# 0.7 - 4.0 K/uL 1.5      There is no height or weight on file to calculate BMI.  Orders:  Orders Placed This Encounter  Procedures   XR Knee 1-2 Views Right   No orders of the defined types were placed in this encounter.    Procedures: Large Joint Inj: R knee on 10/11/2023 5:04 PM Indications: pain and diagnostic evaluation Details: 22 G 1.5 in needle, anteromedial approach  Arthrogram: No  Medications: 5 mL lidocaine  (PF) 1 %; 40 mg methylPREDNISolone acetate 40 MG/ML Outcome: tolerated well, no immediate complications Procedure, treatment alternatives, risks and benefits explained, specific risks discussed. Consent was given by the patient. Immediately prior to procedure a time out was called to verify the correct patient, procedure, equipment, support staff and site/side marked as required. Patient was prepped and draped in the usual sterile fashion.      Clinical Data: No additional findings.  ROS:  All other systems negative, except as noted in the HPI. Review of Systems  Objective: Vital Signs: There were no vitals taken for this visit.  Specialty Comments:  No specialty comments available.  PMFS History: Patient Active Problem List   Diagnosis Date Noted   Primary osteoarthritis of both first carpometacarpal joints 04/25/2018   Past Medical History:  Diagnosis Date   Blood transfusion without reported diagnosis 1989   during laparoscopy   Melanoma (HCC) 2004   left buttocks   Seasonal allergies     Family History  Problem Relation Age of Onset   Colon cancer Neg Hx    Esophageal cancer Neg Hx     Rectal cancer Neg Hx    Stomach cancer Neg Hx     Past Surgical History:  Procedure Laterality Date   ABDOMINAL HYSTERECTOMY  1997   LAPAROSCOPY  1990   for endometriosis; punctured aorta during surgery, per pt didn't put intestines back in correctly- had to go back in and repostiton correctly   LAPAROSCOPY     x2- prior to puncturing of aorta   melanoma surgery Left 2004   buttocks   Social History   Occupational History   Not on file  Tobacco Use   Smoking status: Never   Smokeless tobacco: Never  Vaping Use   Vaping status: Never Used  Substance and Sexual Activity   Alcohol use: Yes    Comment: rare   Drug use: No   Sexual activity: Not on file

## 2024-01-24 ENCOUNTER — Encounter: Payer: Self-pay | Admitting: Orthopedic Surgery

## 2024-01-24 ENCOUNTER — Ambulatory Visit: Admitting: Orthopedic Surgery

## 2024-01-24 DIAGNOSIS — G8929 Other chronic pain: Secondary | ICD-10-CM | POA: Diagnosis not present

## 2024-01-24 DIAGNOSIS — M25561 Pain in right knee: Secondary | ICD-10-CM

## 2024-01-24 DIAGNOSIS — M1711 Unilateral primary osteoarthritis, right knee: Secondary | ICD-10-CM

## 2024-01-24 MED ORDER — LIDOCAINE HCL (PF) 1 % IJ SOLN
5.0000 mL | INTRAMUSCULAR | Status: AC | PRN
  Administered 2024-01-24: 5 mL

## 2024-01-24 MED ORDER — METHYLPREDNISOLONE ACETATE 40 MG/ML IJ SUSP
40.0000 mg | INTRAMUSCULAR | Status: AC | PRN
  Administered 2024-01-24: 40 mg via INTRA_ARTICULAR

## 2024-01-24 NOTE — Progress Notes (Signed)
 Office Visit Note   Patient: Natasha Foster           Date of Birth: May 27, 1960           MRN: [id number] Visit Date: 01/24/2024              Requested by: Chrystal Lamarr RAMAN, MD [address] [place],  [place] [address] PCP: Chrystal Lamarr RAMAN, MD  Chief Complaint  Patient presents with   Right Knee - Pain    S/p cortisone injection 09/2022      HPI: Discussed the use of AI scribe software for clinical note transcription with the patient, who gave verbal consent to proceed.  History of Present Illness Natasha Foster is a 63 year old female who presents with knee pain and a request for another steroid injection.  The previous steroid injection provided significant relief for about three months. Initially, the injection site was painful, but she experienced substantial improvement in her ability to walk after about ten days. She describes the effect as 'night and day' and 'wonderful after that first few days.'  She inquires about the possibility of needing a procedure on her other knee, expressing concern about potential differences in leg length post-procedure. Her other knee does not currently hurt.  She discusses her neighbor's experience with a partial knee replacement and questions whether she would be a candidate for such a procedure.  She plans to schedule her next appointment and notes that she initially thought she had to wait until October 1st for another injection, but was informed that there is no strict timeline, although a three-month interval is ideal.     Assessment & Plan: Visit Diagnoses:  1. Chronic pain of right knee   2. Unilateral primary osteoarthritis, right knee     Plan: Assessment and Plan Assessment & Plan Right knee osteoarthritis Chronic osteoarthritis with varus alignment and mild effusion. Previous steroid injection provided significant pain relief. Total knee replacement recommended over partial due to likely wear in other  areas. - Administered steroid injection to right knee. - Discussed potential for total knee replacement if symptoms persist or worsen.      Follow-Up Instructions: Return if symptoms worsen or fail to improve.   Ortho Exam  Patient is alert, oriented, no adenopathy, well-dressed, normal affect, normal respiratory effort. Physical Exam MUSCULOSKELETAL: Antalgic gait with varus alignment, right worse than left. Mild knee effusion. Collateral and cruciate ligaments stable. Crepitus on range of motion.      Imaging: No results found. No images are attached to the encounter.  Labs: Lab Results  Component Value Date   ESRSEDRATE 12 10/10/2018     Lab Results  Component Value Date   ALBUMIN 4.2 10/10/2018    No results found for: MG No results found for: VD25OH  No results found for: PREALBUMIN    Latest Ref Rng & Units 10/10/2018    9:47 AM  CBC EXTENDED  WBC 4.0 - 10.5 K/uL 4.4   RBC 3.87 - 5.11 Mil/uL 4.38   Hemoglobin 12.0 - 15.0 g/dL 87.3   HCT 63.9 - 53.9 % 37.6   Platelets 150.0 - 400.0 K/uL 256.0   NEUT# 1.4 - 7.7 K/uL 2.3   Lymph# 0.7 - 4.0 K/uL 1.5      There is no height or weight on file to calculate BMI.  Orders:  No orders of the defined types were placed in this encounter.  No orders of the defined types were placed in  this encounter.    Procedures: Large Joint Inj: R knee on 01/24/2024 11:43 AM Indications: pain and diagnostic evaluation Details: 22 G 1.5 in needle, anteromedial approach  Arthrogram: No  Medications: 5 mL lidocaine  (PF) 1 %; 40 mg methylPREDNISolone  acetate 40 MG/ML Outcome: tolerated well, no immediate complications Procedure, treatment alternatives, risks and benefits explained, specific risks discussed. Consent was given by the patient. Immediately prior to procedure a time out was called to verify the correct patient, procedure, equipment, support staff and site/side marked as required. Patient was prepped and  draped in the usual sterile fashion.      Clinical Data: No additional findings.  ROS:  All other systems negative, except as noted in the HPI. Review of Systems  Objective: Vital Signs: There were no vitals taken for this visit.  Specialty Comments:  No specialty comments available.  PMFS History: Patient Active Problem List   Diagnosis Date Noted   Primary osteoarthritis of both first carpometacarpal joints 04/25/2018   Past Medical History:  Diagnosis Date   Blood transfusion without reported diagnosis 1989   during laparoscopy   Melanoma (HCC) 2004   left buttocks   Seasonal allergies     Family History  Problem Relation Age of Onset   Colon cancer Neg Hx    Esophageal cancer Neg Hx    Rectal cancer Neg Hx    Stomach cancer Neg Hx     Past Surgical History:  Procedure Laterality Date   ABDOMINAL HYSTERECTOMY  1997   LAPAROSCOPY  1990   for endometriosis; punctured aorta during surgery, per pt didn't put intestines back in correctly- had to go back in and repostiton correctly   LAPAROSCOPY     x2- prior to puncturing of aorta   melanoma surgery Left 2004   buttocks   Social History   Occupational History   Not on file  Tobacco Use   Smoking status: Never   Smokeless tobacco: Never  Vaping Use   Vaping status: Never Used  Substance and Sexual Activity   Alcohol use: Yes    Comment: rare   Drug use: No   Sexual activity: Not on file

## 2024-02-28 ENCOUNTER — Encounter: Payer: Self-pay | Admitting: Radiology

## 2024-03-25 ENCOUNTER — Encounter: Payer: Self-pay | Admitting: Internal Medicine

## 2024-03-31 ENCOUNTER — Telehealth: Payer: Self-pay

## 2024-03-31 NOTE — Telephone Encounter (Signed)
 Attempted to reach patient concerning colonoscopy recall; unable to speak with patient;  left message and number to the office for patient to call back and schedule appts;

## 2024-04-19 NOTE — Telephone Encounter (Signed)
 Attempted to reach patient concerning colonoscopy recall; unable to speak with patient;  left message and number to the office for patient to call back and schedule appts;

## 2024-04-24 ENCOUNTER — Encounter: Payer: Self-pay | Admitting: Orthopedic Surgery

## 2024-04-24 ENCOUNTER — Ambulatory Visit: Admitting: Orthopedic Surgery

## 2024-04-24 DIAGNOSIS — M1711 Unilateral primary osteoarthritis, right knee: Secondary | ICD-10-CM

## 2024-04-24 MED ORDER — METHYLPREDNISOLONE ACETATE 40 MG/ML IJ SUSP
40.0000 mg | INTRAMUSCULAR | Status: AC | PRN
  Administered 2024-04-24: 40 mg via INTRA_ARTICULAR

## 2024-04-24 MED ORDER — LIDOCAINE HCL (PF) 1 % IJ SOLN
5.0000 mL | INTRAMUSCULAR | Status: AC | PRN
  Administered 2024-04-24: 5 mL

## 2024-04-24 NOTE — Progress Notes (Signed)
 "  Office Visit Note   Patient: Natasha Foster           Date of Birth: 1960-06-19           MRN: [id number] Visit Date: 04/24/2024              Requested by: Chrystal Lamarr RAMAN, MD [address] [place],  [place] [address] PCP: Chrystal Lamarr RAMAN, MD  Chief Complaint  Patient presents with   Right Knee - Pain      HPI: Discussed the use of AI scribe software for clinical note transcription with the patient, who gave verbal consent to proceed.  History of Present Illness Natasha Foster is a 63 year old female with right knee osteoarthritis who presents for follow-up and repeat corticosteroid injection due to persistent right knee symptoms.  She reports ongoing right knee swelling and stiffness, which typically worsen toward the end of the three-month interval between steroid injections. She describes mild swelling and a sensation of pulling, particularly with squatting or gardening, which can be painful and causes her to stand up quickly. She denies significant pain with routine activities and is able to walk, ride her bike, and perform most daily tasks, though she avoids running. She denies erythema, fever, or other signs of infection.  Initially, she experienced severe right knee pain and swelling, requiring special footwear due to difficulty ambulating, and pain along the lateral aspect of her foot, which has since resolved. Her symptoms have improved significantly since starting serial corticosteroid injections, with good interval relief and only mild swelling and stiffness recurring at the end of each injection cycle. She has not required additional medications for her knee symptoms.     Assessment & Plan: Visit Diagnoses:  1. Unilateral primary osteoarthritis, right knee     Plan: Assessment and Plan Assessment & Plan Primary osteoarthritis of the right knee Chronic primary osteoarthritis with mild effusion, well managed with corticosteroid injections. Good  relief and minimal pain reported. Conservative management effective. - Administered repeat corticosteroid injection to the right knee. - Advised follow-up in three months or sooner if symptoms worsen or she elects for total knee arthroplasty. - Discussed continued conservative management with corticosteroid injections as long as relief and functional mobility are acceptable. - Reviewed total knee arthroplasty as an option if pain or functional limitations become unacceptable, including postoperative pain, rehabilitation, readiness for recovery, intraoperative sizing techniques, and surgical outcomes.      Follow-Up Instructions: Return if symptoms worsen or fail to improve.   Ortho Exam  Patient is alert, oriented, no adenopathy, well-dressed, normal affect, normal respiratory effort. Physical Exam MUSCULOSKELETAL: Right knee with mild effusion, no redness, no cellulitis. Collaterals and cruciates stable.      Imaging: No results found. No images are attached to the encounter.  Labs: Lab Results  Component Value Date   ESRSEDRATE 12 10/10/2018     Lab Results  Component Value Date   ALBUMIN 4.2 10/10/2018    No results found for: MG No results found for: VD25OH  No results found for: PREALBUMIN    Latest Ref Rng & Units 10/10/2018    9:47 AM  CBC EXTENDED  WBC 4.0 - 10.5 K/uL 4.4   RBC 3.87 - 5.11 Mil/uL 4.38   Hemoglobin 12.0 - 15.0 g/dL 87.3   HCT 63.9 - 53.9 % 37.6   Platelets 150.0 - 400.0 K/uL 256.0   NEUT# 1.4 - 7.7 K/uL 2.3   Lymph# 0.7 - 4.0 K/uL 1.5  There is no height or weight on file to calculate BMI.  Orders:  No orders of the defined types were placed in this encounter.  No orders of the defined types were placed in this encounter.    Procedures: Large Joint Inj: R knee on 04/24/2024 11:35 AM Indications: pain and diagnostic evaluation Details: 22 G 1.5 in needle, anteromedial approach  Arthrogram: No  Medications: 5 mL  lidocaine  (PF) 1 %; 40 mg methylPREDNISolone  acetate 40 MG/ML Outcome: tolerated well, no immediate complications Procedure, treatment alternatives, risks and benefits explained, specific risks discussed. Consent was given by the patient. Immediately prior to procedure a time out was called to verify the correct patient, procedure, equipment, support staff and site/side marked as required. Patient was prepped and draped in the usual sterile fashion.      Clinical Data: No additional findings.  ROS:  All other systems negative, except as noted in the HPI. Review of Systems  Objective: Vital Signs: There were no vitals taken for this visit.  Specialty Comments:  No specialty comments available.  PMFS History: Patient Active Problem List   Diagnosis Date Noted   Primary osteoarthritis of both first carpometacarpal joints 04/25/2018   Past Medical History:  Diagnosis Date   Blood transfusion without reported diagnosis 1989   during laparoscopy   Melanoma (HCC) 2004   left buttocks   Seasonal allergies     Family History  Problem Relation Age of Onset   Colon cancer Neg Hx    Esophageal cancer Neg Hx    Rectal cancer Neg Hx    Stomach cancer Neg Hx     Past Surgical History:  Procedure Laterality Date   ABDOMINAL HYSTERECTOMY  1997   LAPAROSCOPY  1990   for endometriosis; punctured aorta during surgery, per pt didn't put intestines back in correctly- had to go back in and repostiton correctly   LAPAROSCOPY     x2- prior to puncturing of aorta   melanoma surgery Left 2004   buttocks   Social History   Occupational History   Not on file  Tobacco Use   Smoking status: Never   Smokeless tobacco: Never  Vaping Use   Vaping status: Never Used  Substance and Sexual Activity   Alcohol use: Yes    Comment: rare   Drug use: No   Sexual activity: Not on file         "
